# Patient Record
Sex: Female | Born: 1945
Health system: Southern US, Community
[De-identification: ages and names within clinical notes are randomized; demographics above are authoritative.]

## PROBLEM LIST (undated history)

## (undated) DIAGNOSIS — F0390 Unspecified dementia without behavioral disturbance: Secondary | ICD-10-CM

## (undated) DIAGNOSIS — E119 Type 2 diabetes mellitus without complications: Secondary | ICD-10-CM

## (undated) DIAGNOSIS — G5601 Carpal tunnel syndrome, right upper limb: Secondary | ICD-10-CM

## (undated) HISTORY — DX: Unspecified dementia, unspecified severity, without behavioral disturbance, psychotic disturbance, mood disturbance, and anxiety: F03.90

## (undated) HISTORY — DX: Type 2 diabetes mellitus without complications: E11.9

## (undated) HISTORY — PX: ABDOMINAL HYSTERECTOMY: SHX81

## (undated) HISTORY — DX: Carpal tunnel syndrome, right upper limb: G56.01

---

## 2013-12-12 DIAGNOSIS — F02818 Dementia in other diseases classified elsewhere, unspecified severity, with other behavioral disturbance: Secondary | ICD-10-CM

## 2013-12-12 HISTORY — DX: Dementia in other diseases classified elsewhere, unspecified severity, with other behavioral disturbance: F02.818

## 2014-08-24 DIAGNOSIS — R0782 Intercostal pain: Secondary | ICD-10-CM

## 2014-08-24 HISTORY — DX: Intercostal pain: R07.82

## 2015-07-03 DIAGNOSIS — M25511 Pain in right shoulder: Secondary | ICD-10-CM

## 2015-07-03 DIAGNOSIS — M5431 Sciatica, right side: Secondary | ICD-10-CM

## 2015-07-03 HISTORY — DX: Sciatica, right side: M54.31

## 2015-07-03 HISTORY — DX: Pain in right shoulder: M25.511

## 2015-08-12 DIAGNOSIS — M25562 Pain in left knee: Secondary | ICD-10-CM | POA: Insufficient documentation

## 2016-01-20 DIAGNOSIS — G5621 Lesion of ulnar nerve, right upper limb: Secondary | ICD-10-CM | POA: Insufficient documentation

## 2016-01-20 HISTORY — DX: Lesion of ulnar nerve, right upper limb: G56.21

## 2017-05-10 DIAGNOSIS — M542 Cervicalgia: Secondary | ICD-10-CM

## 2017-05-10 HISTORY — DX: Cervicalgia: M54.2

## 2018-05-17 DIAGNOSIS — J309 Allergic rhinitis, unspecified: Secondary | ICD-10-CM | POA: Insufficient documentation

## 2018-05-17 DIAGNOSIS — E66811 Obesity, class 1: Secondary | ICD-10-CM

## 2018-05-17 DIAGNOSIS — E669 Obesity, unspecified: Secondary | ICD-10-CM | POA: Insufficient documentation

## 2018-05-17 DIAGNOSIS — M549 Dorsalgia, unspecified: Secondary | ICD-10-CM | POA: Insufficient documentation

## 2018-05-17 HISTORY — DX: Obesity, class 1: E66.811

## 2018-05-17 HISTORY — DX: Obesity, unspecified: E66.9

## 2018-05-17 HISTORY — DX: Dorsalgia, unspecified: M54.9

## 2018-05-17 HISTORY — DX: Allergic rhinitis, unspecified: J30.9

## 2018-11-15 DIAGNOSIS — R202 Paresthesia of skin: Secondary | ICD-10-CM

## 2018-11-15 DIAGNOSIS — R7301 Impaired fasting glucose: Secondary | ICD-10-CM

## 2018-11-15 HISTORY — DX: Impaired fasting glucose: R73.01

## 2018-11-15 HISTORY — DX: Paresthesia of skin: R20.2

## 2020-08-05 ENCOUNTER — Ambulatory Visit: Payer: Medicare PPO | Admitting: Internal Medicine

## 2020-08-05 ENCOUNTER — Encounter: Payer: Self-pay | Admitting: Internal Medicine

## 2020-08-05 ENCOUNTER — Other Ambulatory Visit: Payer: Self-pay

## 2020-08-05 ENCOUNTER — Telehealth: Payer: Self-pay | Admitting: *Deleted

## 2020-08-05 VITALS — BP 110/80 | HR 88 | Temp 98.6°F | Ht 63.39 in | Wt 183.0 lb

## 2020-08-05 DIAGNOSIS — R4586 Emotional lability: Secondary | ICD-10-CM

## 2020-08-05 DIAGNOSIS — E119 Type 2 diabetes mellitus without complications: Secondary | ICD-10-CM | POA: Diagnosis not present

## 2020-08-05 DIAGNOSIS — F32A Depression, unspecified: Secondary | ICD-10-CM | POA: Diagnosis not present

## 2020-08-05 DIAGNOSIS — F0390 Unspecified dementia without behavioral disturbance: Secondary | ICD-10-CM | POA: Diagnosis not present

## 2020-08-05 LAB — MICROSCOPIC EXAMINATION: WBC, UA: NONE SEEN /hpf (ref 0–5)

## 2020-08-05 LAB — URINALYSIS, ROUTINE W REFLEX MICROSCOPIC
Bilirubin, UA: NEGATIVE
Glucose, UA: NEGATIVE
Ketones, UA: NEGATIVE
Leukocytes,UA: NEGATIVE
Nitrite, UA: NEGATIVE
Specific Gravity, UA: >1.03 — ABNORMAL HIGH (ref 1.005–1.030)
Urobilinogen, Ur: 0.2 mg/dL (ref 0.2–1.0)
pH, UA: 5 (ref 5.0–7.5)

## 2020-08-05 LAB — BAYER DCA HB A1C WAIVED: HB A1C (BAYER DCA - WAIVED): 5.7 % (ref <7.0)

## 2020-08-05 MED ORDER — CITALOPRAM HYDROBROMIDE 10 MG PO TABS: 10.0000 mg | ORAL_TABLET | Freq: Every day | ORAL | 1 refills | Status: DC

## 2020-08-05 NOTE — Chronic Care Management (AMB) (Deleted)
  Care Management  Note   08/05/2020 Name: Ronnisha Felber MRN: [id number] DOB: 01/29/1946  Veeda Virgo is a 75 y.o. year old female who is a primary care patient of [name], [name], MD. The care management team was consulted for assistance with chronic disease management and care coordination needs.   Ms. Jerrett was given information about Care Management services today including:  CCM service includes personalized support from designated clinical staff supervised by the physician, including individualized plan of care and coordination with other care providers 24/7 contact phone numbers for assistance for urgent and routine care needs. Service will only be billed when office clinical staff spend 20 minutes or more in a month to coordinate care. Only one practitioner may furnish and bill the service in a calendar month. The patient may stop CCM services at amy time (effective at the end of the month) by phone call to the office staff. The patient will be responsible for cost sharing (co-pay) or up to 20% of the service fee (after annual deductible is met)  Patient agreed to services and verbal consent obtained.  Follow up plan:   Telephone follow up appointment with care management team member scheduled for:  08/15/20 - message sent to SW to see if any sooner availability per pt request   Julian Hy, [place] Management  Direct Dial: [phone]
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
## 2020-08-05 NOTE — Chronic Care Management (AMB) (Signed)
  Chronic Care Management   Note  08/05/2020 Name: Erin Patrick MRN: [id number] DOB: [birth year]  Erin Patrick is a 75 y.o. year old female who is a primary care patient of [name], [name], MD. I reached out to [facility] by phone today in response to a referral sent by Erin Patrick's PCP Charlynne Cousins, MD     Erin Patrick was given information about Chronic Care Management services today including:  CCM service includes personalized support from designated clinical staff supervised by her physician, including individualized plan of care and coordination with other care providers 24/7 contact phone numbers for assistance for urgent and routine care needs. Service will only be billed when office clinical staff spend 20 minutes or more in a month to coordinate care. Only one practitioner may furnish and bill the service in a calendar month. The patient may stop CCM services at any time (effective at the end of the month) by phone call to the office staff. The patient will be responsible for cost sharing (co-pay) of up to 20% of the service fee (after annual deductible is met).  Patient agreed to services and verbal consent obtained.   Follow up plan: Telephone appointment with care management team member scheduled for: 08/15/2020  Julian Hy, [place] Management  Direct Dial: [phone]

## 2020-08-06 LAB — CBC WITH DIFFERENTIAL/PLATELET
Basophils Absolute: 0 10*3/uL (ref 0.0–0.2)
Basos: 0 %
EOS (ABSOLUTE): 0 10*3/uL (ref 0.0–0.4)
Eos: 1 %
Hematocrit: 43 % (ref 34.0–46.6)
Hemoglobin: 14.3 g/dL (ref 11.1–15.9)
Immature Grans (Abs): 0 10*3/uL (ref 0.0–0.1)
Immature Granulocytes: 0 %
Lymphocytes Absolute: 2.1 10*3/uL (ref 0.7–3.1)
Lymphs: 45 %
MCH: 27.9 pg (ref 26.6–33.0)
MCHC: 33.3 g/dL (ref 31.5–35.7)
MCV: 84 fL (ref 79–97)
Monocytes Absolute: 0.4 10*3/uL (ref 0.1–0.9)
Monocytes: 9 %
Neutrophils Absolute: 2.1 10*3/uL (ref 1.4–7.0)
Neutrophils: 45 %
Platelets: 270 10*3/uL (ref 150–450)
RBC: 5.13 x10E6/uL (ref 3.77–5.28)
RDW: 13.9 % (ref 11.7–15.4)
WBC: 4.6 10*3/uL (ref 3.4–10.8)

## 2020-08-06 LAB — BASIC METABOLIC PANEL
BUN/Creatinine Ratio: 11 — ABNORMAL LOW (ref 12–28)
BUN: 11 mg/dL (ref 8–27)
CO2: 19 mmol/L — ABNORMAL LOW (ref 20–29)
Calcium: 9.9 mg/dL (ref 8.7–10.3)
Chloride: 104 mmol/L (ref 96–106)
Creatinine, Ser: 1.01 mg/dL — ABNORMAL HIGH (ref 0.57–1.00)
Glucose: 124 mg/dL — ABNORMAL HIGH (ref 65–99)
Potassium: 4.4 mmol/L (ref 3.5–5.2)
Sodium: 140 mmol/L (ref 134–144)
eGFR: 57 mL/min/{1.73_m2} — ABNORMAL LOW (ref >59)

## 2020-08-06 LAB — HEPATIC FUNCTION PANEL
ALT: 27 IU/L (ref 0–32)
AST: 35 IU/L (ref 0–40)
Albumin: 4.9 g/dL — ABNORMAL HIGH (ref 3.7–4.7)
Alkaline Phosphatase: 96 IU/L (ref 44–121)
Bilirubin Total: 0.3 mg/dL (ref 0.0–1.2)
Bilirubin, Direct: 0.11 mg/dL (ref 0.00–0.40)
Total Protein: 7.3 g/dL (ref 6.0–8.5)

## 2020-08-06 LAB — LIPID PANEL
Chol/HDL Ratio: 3.6 ratio (ref 0.0–4.4)
Cholesterol, Total: 224 mg/dL — ABNORMAL HIGH (ref 100–199)
HDL: 63 mg/dL (ref >39)
LDL Chol Calc (NIH): 150 mg/dL — ABNORMAL HIGH (ref 0–99)
Triglycerides: 65 mg/dL (ref 0–149)
VLDL Cholesterol Cal: 11 mg/dL (ref 5–40)

## 2020-08-06 LAB — TSH: TSH: 1.58 u[IU]/mL (ref 0.450–4.500)

## 2020-08-06 LAB — VITAMIN B12: Vitamin B-12: 516 pg/mL (ref 232–1245)

## 2020-08-08 ENCOUNTER — Encounter: Payer: Self-pay | Admitting: Physician Assistant

## 2020-08-14 ENCOUNTER — Telehealth: Payer: Self-pay

## 2020-08-14 MED ORDER — ATORVASTATIN CALCIUM 10 MG PO TABS: 10.0000 mg | ORAL_TABLET | Freq: Every day | ORAL | 3 refills | Status: DC

## 2020-08-14 NOTE — Telephone Encounter (Signed)
LDL high everything else wnl, pl let her know I have sent in a statin for such and they need to fu as scheudled. thnx

## 2020-08-14 NOTE — Telephone Encounter (Signed)
Routing to provider to review labs. Do no see result message.

## 2020-08-14 NOTE — Telephone Encounter (Signed)
Patient's daughter notified.

## 2020-08-14 NOTE — Telephone Encounter (Signed)
Copied from CRM [id number]. Topic: General - Other >> Aug 14, 2020 10:26 AM Wyonia Hough E wrote: Reason for CRM: Pt daughter Elita Quick would like a call from the nurse so pt can hear about her lab results / please advise

## 2020-08-15 ENCOUNTER — Ambulatory Visit (INDEPENDENT_AMBULATORY_CARE_PROVIDER_SITE_OTHER): Payer: Medicare PPO | Admitting: Licensed Clinical Social Worker

## 2020-08-15 DIAGNOSIS — E119 Type 2 diabetes mellitus without complications: Secondary | ICD-10-CM | POA: Diagnosis not present

## 2020-08-15 DIAGNOSIS — F0390 Unspecified dementia without behavioral disturbance: Secondary | ICD-10-CM | POA: Diagnosis not present

## 2020-08-15 DIAGNOSIS — F32A Depression, unspecified: Secondary | ICD-10-CM

## 2020-08-15 NOTE — Patient Instructions (Signed)
Visit Information   Goals Addressed             This Visit's Progress    Depressive Symptoms Identified   On track    Patient Goals/Self-Care Activities: Over the next 120 days Attend all scheduled appointments with providers Contact clinic with any additional questions or concerns Continue compliance with medications Call [facility] to schedule your appointment for med management         Patient verbalizes understanding of instructions provided today and agrees to view in MyChart.   Telephone follow up appointment with care management team member scheduled for:09/12/20  [name], MSW, LCSW [facility] Care Management [facility]  [facility] [email or web address] Phone [phone] 12:17 PM

## 2020-08-15 NOTE — Chronic Care Management (AMB) (Signed)
Chronic Care Management    Clinical Social Work Note  08/15/2020 Name: Erin Patrick MRN: [id number] DOB: [birth year]  Erin Patrick is a 75 y.o. year old female who is a primary care patient of [name], [name], MD. The CCM team was consulted to assist the patient with chronic disease management and/or care coordination needs related to: [place] and Resources.   Engaged with patient's adult daughter, Erin Patrick, by telephone for initial visit in response to provider referral for social work chronic care management and care coordination services.   Consent to Services:  The patient was given the following information about Chronic Care Management services today, agreed to services, and gave verbal consent: 1. CCM service includes personalized support from designated clinical staff supervised by the primary care provider, including individualized plan of care and coordination with other care providers 2. 24/7 contact phone numbers for assistance for urgent and routine care needs. 3. Service will only be billed when office clinical staff spend 20 minutes or more in a month to coordinate care. 4. Only one practitioner may furnish and bill the service in a calendar month. 5.The patient may stop CCM services at any time (effective at the end of the month) by phone call to the office staff. 6. The patient will be responsible for cost sharing (co-pay) of up to 20% of the service fee (after annual deductible is met). Patient agreed to services and consent obtained.  Patient agreed to services and consent obtained.   Assessment: Patient is currently experiencing difficulty with management of depression symptoms. She receives strong support from family and has been compliant with medications. CCM LCSW provided family with contact information for [facility] and strongly encouraged them to schedule an initial appointment. Daughter would like to discuss with PCP if sertraline would be appropriate for patient to  assist with management of symptoms. This medication was recommended by [facility] home nurse. See Care Plan below for interventions and patient self-care actives. Recent life changes /stressors: Management of depression symptoms Recommendation: Patient may benefit from, and is in agreement to work with LCSW to address care coordination needs and will continue to work with the clinical team to address health care and disease management related needs.  Follow up Plan: Patient would like continued follow-up from CCM LCSW .  Follow up scheduled in on 09/12/20. Patient will call office if needed prior to next encounter.  SDOH (Social Determinants of Health) assessments and interventions performed:  SDOH Interventions    Flowsheet Row Most Recent Value  SDOH Interventions   Food Insecurity Interventions Intervention Not Indicated  Housing Interventions Intervention Not Indicated  Transportation Interventions Intervention Not Indicated        Advanced Directives Status: Not addressed in this encounter.  CCM Care Plan  Allergies  Allergen Reactions   Penicillins     Outpatient Encounter Medications as of 08/15/2020  Medication Sig   atorvastatin (LIPITOR) 10 MG tablet Take 1 tablet (10 mg total) by mouth daily.   citalopram (CELEXA) 10 MG tablet Take 1 tablet (10 mg total) by mouth daily.   No facility-administered encounter medications on file as of 08/15/2020.    There are no problems to display for this patient.   Conditions to be addressed/monitored: Depression; Mental Health Concerns   Care Plan : General Social Work (Adult)  Updates made by Erin Chesterfield, LCSW since 08/15/2020 12:00 AM     Problem: Depression Identification (Depression)      Long-Range Goal: Management of Depression Symptoms  Start Date: 08/15/2020  This Visit's Progress: On track  Priority: High  Note:   Current barriers:   Acute Mental Health needs related to depression Mental Health Concerns  Needs  Support, Education, and Care Coordination in order to meet unmet mental health needs. Clinical Goal(s): Over the next 120 days, patient will work with SW, counselor and therapist to reduce or manage symptoms of agitation, mood instability, stress, and bipolar until connected for ongoing counseling. Clinical Interventions:  Assessed patient's previous and current treatment, coping skills, support system and barriers to care  Patient's daughter, Erin Patrick, provided all information during visit Patient has difficulty managing recent increase in depression symptoms Patient's Nurse from [facility] completes home visits occasionally. Recently suggested that patient is prescribed sertraline due to success with management of symptoms for patients diagnosed with dementia.  Patient has been compliant with medications. There has been no side effects or decrease in symptoms as of yet Patient has an appointment with Neurology scheduled Patient receives strong support from family. She resides with daughter Patient may go grocery shopping with daughter, at times; however, she prefers to remain in the home Family are not interested in day programs or placement CCM LCSW reviewed upcoming appointments Depression screen reviewed , Solution-Focused Strategies, Active listening / Reflection utilized , Emotional Supportive Provided, Reviewed mental health medications with patient and discussed compliance: Patient is compliant with medications, and Caregiver stress acknowledged   Discussed several options for long term counseling based on need and insurance. Daughter prefers to complete psychiatry appointment prior to referring for long-term counseling Collaboration with PCP regarding development and update of comprehensive plan of care as evidenced by provider attestation and co-signature Inter-disciplinary care team collaboration (see longitudinal plan of care) Patient Goals/Self-Care Activities: Over the next 120  days Attend all scheduled appointments with providers Contact clinic with any additional questions or concerns Continue compliance with medications Call [facility] to schedule your appointment for med management       Christa See, MSW, [email or web address] Phone [phone] 12:16 PM

## 2020-08-16 ENCOUNTER — Other Ambulatory Visit: Payer: Self-pay

## 2020-08-16 ENCOUNTER — Encounter: Payer: Self-pay | Admitting: Physician Assistant

## 2020-08-16 ENCOUNTER — Ambulatory Visit: Payer: Medicare PPO | Admitting: Physician Assistant

## 2020-08-16 VITALS — BP 132/83 | HR 79 | Ht 62.0 in | Wt 180.2 lb

## 2020-08-16 DIAGNOSIS — R413 Other amnesia: Secondary | ICD-10-CM

## 2020-08-16 NOTE — Patient Instructions (Signed)
It was a pleasure to see you today at our office.   Recommendations:  Neurocognitive evaluation at our office MRI of the brain, the office will call you to arrange you appointment Follow up once the results of the above are available   RECOMMENDATIONS FOR ALL PATIENTS WITH MEMORY PROBLEMS: 1. Continue to exercise (Recommend 30 minutes of walking everyday, or 3 hours every week) 2. Increase social interactions - continue going to Church and enjoy social gatherings with friends and family 3. Eat healthy, avoid fried foods and eat more fruits and vegetables 4. Maintain adequate blood pressure, blood sugar, and blood cholesterol level. Reducing the risk of stroke and cardiovascular disease also helps promoting better memory. 5. Avoid stressful situations. Live a simple life and avoid aggravations. Organize your time and prepare for the next day in anticipation. 6. Sleep well, avoid any interruptions of sleep and avoid any distractions in the bedroom that may interfere with adequate sleep quality 7. Avoid sugar, avoid sweets as there is a strong link between excessive sugar intake, diabetes, and cognitive impairment We discussed the Mediterranean diet, which has been shown to help patients reduce the risk of progressive memory disorders and reduces cardiovascular risk. This includes eating fish, eat fruits and green leafy vegetables, nuts like almonds and hazelnuts, walnuts, and also use olive oil. Avoid fast foods and fried foods as much as possible. Avoid sweets and sugar as sugar use has been linked to worsening of memory function.  There is always a concern of gradual progression of memory problems. If this is the case, then we may need to adjust level of care according to patient needs. Support, both to the patient and caregiver, should then be put into place.      You have been referred for a neuropsychological evaluation (i.e., evaluation of memory and thinking abilities). Please bring  someone with you to this appointment if possible, as it is helpful for the doctor to hear from both you and another adult who knows you well. Please bring eyeglasses and hearing aids if you wear them.    The evaluation will take approximately 3 hours and has two parts:   The first part is a clinical interview with the neuropsychologist (Dr. Merz or Dr. Stewart). During the interview, the neuropsychologist will speak with you and the individual you brought to the appointment.    The second part of the evaluation is testing with the doctor's technician (Dana or Kim). During the testing, the technician will ask you to remember different types of material, solve problems, and answer some questionnaires. Your family member will not be present for this portion of the evaluation.   Please note: We must reserve several hours of the neuropsychologist's time and the psychometrician's time for your evaluation appointment. As such, there is a No-Show fee of $100. If you are unable to attend any of your appointments, please contact our office as soon as possible to reschedule.    FALL PRECAUTIONS: Be cautious when walking. Scan the area for obstacles that may increase the risk of trips and falls. When getting up in the mornings, sit up at the edge of the bed for a few minutes before getting out of bed. Consider elevating the bed at the head end to avoid drop of blood pressure when getting up. Walk always in a well-lit room (use night lights in the walls). Avoid area rugs or power cords from appliances in the middle of the walkways. Use a walker or a cane if   necessary and consider physical therapy for balance exercise. Get your eyesight checked regularly.  FINANCIAL OVERSIGHT: Supervision, especially oversight when making financial decisions or transactions is also recommended.  HOME SAFETY: Consider the safety of the kitchen when operating appliances like stoves, microwave oven, and blender. Consider having  supervision and share cooking responsibilities until no longer able to participate in those. Accidents with firearms and other hazards in the house should be identified and addressed as well.   ABILITY TO BE LEFT ALONE: If patient is unable to contact 911 operator, consider using LifeLine, or when the need is there, arrange for someone to stay with patients. Smoking is a fire hazard, consider supervision or cessation. Risk of wandering should be assessed by caregiver and if detected at any point, supervision and safe proof recommendations should be instituted.  MEDICATION SUPERVISION: Inability to self-administer medication needs to be constantly addressed. Implement a mechanism to ensure safe administration of the medications.   DRIVING: Regarding driving, in patients with progressive memory problems, driving will be impaired. We advise to have someone else do the driving if trouble finding directions or if minor accidents are reported. Independent driving assessment is available to determine safety of driving.   If you are interested in the driving assessment, you can contact the following:  [employer] in [place] [phone]  [facility] [phone]  [facility] [phone]  [facility] [phone] or [phone]    Mediterranean Diet A Mediterranean diet refers to food and lifestyle choices that are based on the traditions of countries located on the [place]. This way of eating has been shown to help prevent certain conditions and improve outcomes for people who have chronic diseases, like kidney disease and heart disease. What are tips for following this plan? Lifestyle  Cook and eat meals together with your family, when possible. Drink enough fluid to keep your urine clear or pale yellow. Be physically active every day. This includes: Aerobic exercise like running or swimming. Leisure activities like gardening,  walking, or housework. Get 7-8 hours of sleep each night. If recommended by your health care provider, drink red wine in moderation. This means 1 glass a day for nonpregnant women and 2 glasses a day for men. A glass of wine equals 5 oz (150 mL). Reading food labels  Check the serving size of packaged foods. For foods such as rice and pasta, the serving size refers to the amount of cooked product, not dry. Check the total fat in packaged foods. Avoid foods that have saturated fat or trans fats. Check the ingredients list for added sugars, such as corn syrup. Shopping  At the grocery store, buy most of your food from the areas near the walls of the store. This includes: Fresh fruits and vegetables (produce). Grains, beans, nuts, and seeds. Some of these may be available in unpackaged forms or large amounts (in bulk). Fresh seafood. Poultry and eggs. Low-fat dairy products. Buy whole ingredients instead of prepackaged foods. Buy fresh fruits and vegetables in-season from local farmers markets. Buy frozen fruits and vegetables in resealable bags. If you do not have access to quality fresh seafood, buy precooked frozen shrimp or canned fish, such as tuna, salmon, or sardines. Buy small amounts of raw or cooked vegetables, salads, or olives from the deli or salad bar at your store. Stock your pantry so you always have certain foods on hand, such as olive oil, canned tuna, canned tomatoes, rice, pasta, and beans. Cooking  Cook foods   with extra-virgin olive oil instead of using butter or other vegetable oils. Have meat as a side dish, and have vegetables or grains as your main dish. This means having meat in small portions or adding small amounts of meat to foods like pasta or stew. Use beans or vegetables instead of meat in common dishes like chili or lasagna. Experiment with different cooking methods. Try roasting or broiling vegetables instead of steaming or sauteing them. Add frozen vegetables  to soups, stews, pasta, or rice. Add nuts or seeds for added healthy fat at each meal. You can add these to yogurt, salads, or vegetable dishes. Marinate fish or vegetables using olive oil, lemon juice, garlic, and fresh herbs. Meal planning  Plan to eat 1 vegetarian meal one day each week. Try to work up to 2 vegetarian meals, if possible. Eat seafood 2 or more times a week. Have healthy snacks readily available, such as: Vegetable sticks with hummus. Greek yogurt. Fruit and nut trail mix. Eat balanced meals throughout the week. This includes: Fruit: 2-3 servings a day Vegetables: 4-5 servings a day Low-fat dairy: 2 servings a day Fish, poultry, or lean meat: 1 serving a day Beans and legumes: 2 or more servings a week Nuts and seeds: 1-2 servings a day Whole grains: 6-8 servings a day Extra-virgin olive oil: 3-4 servings a day Limit red meat and sweets to only a few servings a month What are my food choices? Mediterranean diet Recommended Grains: Whole-grain pasta. Brown rice. Bulgar wheat. Polenta. Couscous. Whole-wheat bread. Oatmeal. Quinoa. Vegetables: Artichokes. Beets. Broccoli. Cabbage. Carrots. Eggplant. Green beans. Chard. Kale. Spinach. Onions. Leeks. Peas. Squash. Tomatoes. Peppers. Radishes. Fruits: Apples. Apricots. Avocado. Berries. Bananas. Cherries. Dates. Figs. Grapes. Lemons. Melon. Oranges. Peaches. Plums. Pomegranate. Meats and other protein foods: Beans. Almonds. Sunflower seeds. Pine nuts. Peanuts. Cod. Salmon. Scallops. Shrimp. Tuna. Tilapia. Clams. Oysters. Eggs. Dairy: Low-fat milk. Cheese. Greek yogurt. Beverages: Water. Red wine. Herbal tea. Fats and oils: Extra virgin olive oil. Avocado oil. Grape seed oil. Sweets and desserts: Greek yogurt with honey. Baked apples. Poached pears. Trail mix. Seasoning and other foods: Basil. Cilantro. Coriander. Cumin. Mint. Parsley. Sage. Rosemary. Tarragon. Garlic. Oregano. Thyme. Pepper. Balsalmic vinegar. Tahini.  Hummus. Tomato sauce. Olives. Mushrooms. Limit these Grains: Prepackaged pasta or rice dishes. Prepackaged cereal with added sugar. Vegetables: Deep fried potatoes (french fries). Fruits: Fruit canned in syrup. Meats and other protein foods: Beef. Pork. Lamb. Poultry with skin. Hot dogs. Bacon. Dairy: Ice cream. Sour cream. Whole milk. Beverages: Juice. Sugar-sweetened soft drinks. Beer. Liquor and spirits. Fats and oils: Butter. Canola oil. Vegetable oil. Beef fat (tallow). Lard. Sweets and desserts: Cookies. Cakes. Pies. Candy. Seasoning and other foods: Mayonnaise. Premade sauces and marinades. The items listed may not be a complete list. Talk with your dietitian about what dietary choices are right for you. Summary The Mediterranean diet includes both food and lifestyle choices. Eat a variety of fresh fruits and vegetables, beans, nuts, seeds, and whole grains. Limit the amount of red meat and sweets that you eat. Talk with your health care provider about whether it is safe for you to drink red wine in moderation. This means 1 glass a day for nonpregnant women and 2 glasses a day for men. A glass of wine equals 5 oz (150 mL). This information is not intended to replace advice given to you by your health care provider. Make sure you discuss any questions you have with your health care provider. Document Released: 09/26/2015 Document Revised: 10/29/2015   Document Reviewed: 09/26/2015 Elsevier Interactive Patient Education  2017 [employer] Inc[employer]     
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
## 2020-08-26 ENCOUNTER — Ambulatory Visit
Admission: RE | Admit: 2020-08-26 | Discharge: 2020-08-26 | Disposition: A | Discharge status: Home / Self Care | Payer: Medicare PPO | Source: Ambulatory Visit | Attending: Internal Medicine | Admitting: Internal Medicine

## 2020-08-26 ENCOUNTER — Ambulatory Visit: Payer: Medicare PPO | Admitting: Internal Medicine

## 2020-08-26 ENCOUNTER — Ambulatory Visit
Admission: RE | Admit: 2020-08-26 | Discharge: 2020-08-26 | Disposition: A | Discharge status: Home / Self Care | Payer: Medicare PPO | Attending: Internal Medicine | Admitting: Internal Medicine

## 2020-08-26 ENCOUNTER — Encounter: Payer: Self-pay | Admitting: Internal Medicine

## 2020-08-26 ENCOUNTER — Other Ambulatory Visit: Payer: Self-pay

## 2020-08-26 VITALS — BP 107/72 | HR 75 | Temp 98.5°F | Ht 63.39 in | Wt 180.4 lb

## 2020-08-26 DIAGNOSIS — F32A Depression, unspecified: Secondary | ICD-10-CM

## 2020-08-26 DIAGNOSIS — E119 Type 2 diabetes mellitus without complications: Secondary | ICD-10-CM

## 2020-08-26 DIAGNOSIS — M25512 Pain in left shoulder: Secondary | ICD-10-CM | POA: Diagnosis not present

## 2020-08-26 DIAGNOSIS — R413 Other amnesia: Secondary | ICD-10-CM

## 2020-08-26 DIAGNOSIS — Z1329 Encounter for screening for other suspected endocrine disorder: Secondary | ICD-10-CM

## 2020-08-26 DIAGNOSIS — Z136 Encounter for screening for cardiovascular disorders: Secondary | ICD-10-CM

## 2020-08-26 MED ORDER — ATORVASTATIN CALCIUM 10 MG PO TABS: 10.0000 mg | ORAL_TABLET | Freq: Every day | ORAL | 3 refills | Status: DC

## 2020-08-26 MED ORDER — CITALOPRAM HYDROBROMIDE 10 MG PO TABS: 10.0000 mg | ORAL_TABLET | Freq: Every morning | ORAL | 1 refills | Status: DC

## 2020-08-26 NOTE — Patient Instructions (Addendum)
Prediabetes Eating Plan Prediabetes is a condition that causes blood sugar (glucose) levels to be higher than normal. This increases the risk for developing type 2 diabetes (type 2 diabetes mellitus). Working with a health care provider or nutrition specialist (dietitian) to make diet and lifestyle changes can help prevent the onset of diabetes. These changes may help you: Control your blood glucose levels. Improve your cholesterol levels. Manage your blood pressure. What are tips for following this plan? Reading food labels Read food labels to check the amount of fat, salt (sodium), and sugar in prepackaged foods. Avoid foods that have: Saturated fats. Trans fats. Added sugars. Avoid foods that have more than 300 milligrams (mg) of sodium per serving. Limit your sodium intake to less than 2,300 mg each day. Shopping Avoid buying pre-made and processed foods. Avoid buying drinks with added sugar. Cooking Cook with olive oil. Do not use butter, lard, or ghee. Bake, broil, grill, steam, or boil foods. Avoid frying. Meal planning  Work with your dietitian to create an eating plan that is right for you. This may include tracking how many calories you take in each day. Use a food diary, notebook, or mobile application to track what you eat at each meal. Consider following a Mediterranean diet. This includes: Eating several servings of fresh fruits and vegetables each day. Eating fish at least twice a week. Eating one serving each day of whole grains, beans, nuts, and seeds. Using olive oil instead of other fats. Limiting alcohol. Limiting red meat. Using nonfat or low-fat dairy products. Consider following a plant-based diet. This includes dietary choices that focus on eating mostly vegetables and fruit, grains, beans, nuts, and seeds. If you have high blood pressure, you may need to limit your sodium intake or follow a diet such as the DASH (Dietary Approaches to Stop Hypertension) eating  plan. The DASH diet aims to lower high blood pressure.  Lifestyle Set weight loss goals with help from your health care team. It is recommended that most people with prediabetes lose 7% of their body weight. Exercise for at least 30 minutes 5 or more days a week. Attend a support group or seek support from a mental health counselor. Take over-the-counter and prescription medicines only as told by your health care provider. What foods are recommended? Fruits Berries. Bananas. Apples. Oranges. Grapes. Papaya. Mango. Pomegranate. Kiwi.Grapefruit. Cherries. Vegetables Lettuce. Spinach. Peas. Beets. Cauliflower. Cabbage. Broccoli. Carrots.Tomatoes. Squash. Eggplant. Herbs. Peppers. Onions. Cucumbers. Brussels sprouts. Grains Whole grains, such as whole-wheat or whole-grain breads, crackers, cereals, and pasta. Unsweetened oatmeal. Bulgur. Barley. Quinoa. Brown rice. Corn orwhole-wheat flour tortillas or taco shells. Meats and other proteins Seafood. Poultry without skin. Lean cuts of pork and beef. Tofu. Eggs. Nuts.Beans. Dairy Low-fat or fat-free dairy products, such as yogurt, cottage cheese, and cheese. Beverages Water. Tea. Coffee. Sugar-free or diet soda. Seltzer water. Low-fat or nonfatmilk. Milk alternatives, such as soy or almond milk. Fats and oils Olive oil. Canola oil. Sunflower oil. Grapeseed oil. Avocado. Walnuts. Sweets and desserts Sugar-free or low-fat pudding. Sugar-free or low-fat ice cream and other frozentreats. Seasonings and condiments Herbs. Sodium-free spices. Mustard. Relish. Low-salt, low-sugar ketchup.Low-salt, low-sugar barbecue sauce. Low-fat or fat-free mayonnaise. The items listed above may not be a complete list of recommended foods and beverages. Contact a dietitian for more information. What foods are not recommended? Fruits Fruits canned with syrup. Vegetables Canned vegetables. Frozen vegetables with butter or cream sauce. Grains Refined white flour and  flour products, such as bread, pasta, snack  foods, andcereals. Meats and other proteins Fatty cuts of meat. Poultry with skin. Breaded or fried meat. Processed meats. Dairy Full-fat yogurt, cheese, or milk. Beverages Sweetened drinks, such as iced tea and soda. Fats and oils Butter. Lard. Ghee. Sweets and desserts Baked goods, such as cake, cupcakes, pastries, cookies, and cheesecake. Seasonings and condiments Spice mixes with added salt. Ketchup. Barbecue sauce. Mayonnaise. The items listed above may not be a complete list of foods and beverages that are not recommended. Contact a dietitian for more information. Where to find more information American Diabetes Association: www.diabetes.org Summary You may need to make diet and lifestyle changes to help prevent the onset of diabetes. These changes can help you control blood sugar, improve cholesterol levels, and manage blood pressure. Set weight loss goals with help from your health care team. It is recommended that most people with prediabetes lose 7% of their body weight. Consider following a Mediterranean diet. This includes eating plenty of fresh fruits and vegetables, whole grains, beans, nuts, seeds, fish, and low-fat dairy, and using olive oil instead of other fats. This information is not intended to replace advice given to you by your health care provider. Make sure you discuss any questions you have with your healthcare provider. Document Revised: 05/04/2019 Document Reviewed: 05/04/2019 Elsevier Patient Education  [address]    Results for Erin Patrick, Erin Patrick (MRN [id number]) as of 08/26/2020 16:08  Ref. Range 08/05/2020 09:38  BASIC METABOLIC PANEL Unknown Rpt (A)  Sodium Latest Ref Range: 134 - 144 mmol/L 140  Potassium Latest Ref Range: 3.5 - 5.2 mmol/L 4.4  Chloride Latest Ref Range: 96 - 106 mmol/L 104  CO2 Latest Ref Range: 20 - 29 mmol/L 19 (L)  Glucose Latest Ref Range: 65 - 99 mg/dL 124 (H)  BUN Latest Ref Range:  8 - 27 mg/dL 11  Creatinine Latest Ref Range: 0.57 - 1.00 mg/dL 1.01 (H)  Calcium Latest Ref Range: 8.7 - 10.3 mg/dL 9.9  BUN/Creatinine Ratio Latest Ref Range: 12 - 28  11 (L)  eGFR Latest Ref Range: >59 mL/min/1.73 57 (L)  Alkaline Phosphatase Latest Ref Range: 44 - 121 IU/L 96  Albumin Latest Ref Range: 3.7 - 4.7 g/dL 4.9 (H)  AST Latest Ref Range: 0 - 40 IU/L 35  ALT Latest Ref Range: 0 - 32 IU/L 27  Total Protein Latest Ref Range: 6.0 - 8.5 g/dL 7.3  Total Bilirubin Latest Ref Range: 0.0 - 1.2 mg/dL 0.3  BILIRUBIN, DIRECT Latest Ref Range: 0.00 - 0.40 mg/dL 0.11  Total CHOL/HDL Ratio Latest Ref Range: 0.0 - 4.4 ratio 3.6  Cholesterol, Total Latest Ref Range: 100 - 199 mg/dL 224 (H)  HDL Cholesterol Latest Ref Range: >39 mg/dL 63  Triglycerides Latest Ref Range: 0 - 149 mg/dL 65  VLDL Cholesterol Cal Latest Ref Range: 5 - 40 mg/dL 11  LDL Chol Calc (NIH) Latest Ref Range: 0 - 99 mg/dL 150 (H)  Vitamin B12 Latest Ref Range: 232 - 1,245 pg/mL 516  WBC Latest Ref Range: 3.4 - 10.8 x10E3/uL 4.6  RBC Latest Ref Range: 3.77 - 5.28 x10E6/uL 5.13  Hemoglobin Latest Ref Range: 11.1 - 15.9 g/dL 14.3  HCT Latest Ref Range: 34.0 - 46.6 % 43.0  MCV Latest Ref Range: 79 - 97 fL 84  MCH Latest Ref Range: 26.6 - 33.0 pg 27.9  MCHC Latest Ref Range: 31.5 - 35.7 g/dL 33.3  RDW Latest Ref Range: 11.7 - 15.4 % 13.9  Platelets Latest Ref Range: 150 - 450  x10E3/uL 270  Neutrophils Latest Ref Range: Not Estab. % 45  Immature Granulocytes Latest Ref Range: Not Estab. % 0  NEUT# Latest Ref Range: 1.4 - 7.0 x10E3/uL 2.1  Lymphocyte # Latest Ref Range: 0.7 - 3.1 x10E3/uL 2.1  Monocytes Absolute Latest Ref Range: 0.1 - 0.9 x10E3/uL 0.4  Basophils Absolute Latest Ref Range: 0.0 - 0.2 x10E3/uL 0.0  Immature Grans (Abs) Latest Ref Range: 0.0 - 0.1 x10E3/uL 0.0  Lymphs Latest Ref Range: Not Estab. % 45  Monocytes Latest Ref Range: Not Estab. % 9  Basos Latest Ref Range: Not Estab. % 0  Eos Latest Ref  Range: Not Estab. % 1  EOS (ABSOLUTE) Latest Ref Range: 0.0 - 0.4 x10E3/uL 0.0  TSH Latest Ref Range: 0.450 - 4.500 uIU/mL 1.580

## 2020-08-26 NOTE — Progress Notes (Signed)
BP 107/72   Pulse 75   Temp 98.5 F (36.9 C) (Oral)   Ht 5' 3.39" (1.61 m)   Wt 180 lb 6.4 oz (81.8 kg)   LMP  (LMP Unknown)   SpO2 97%   BMI 31.57 kg/m    Subjective:    Patient ID: Erin Patrick, female    DOB: [birth year], 75 y.o.   MRN: [id number]  Chief Complaint  Patient presents with   Diabetes   Review labs   Depression    Was prescribed Celexa, has not been taking, states that she lost it.     HPI: Erin Patrick is a 75 y.o. female  Depression        This is a chronic problem.  The onset quality is gradual.   Associated symptoms include no myalgias. Shoulder Pain  This is a chronic problem. The current episode started more than 1 year ago.    Chief Complaint  Patient presents with   Diabetes   Review labs   Depression    Was prescribed Celexa, has not been taking, states that she lost it.     Relevant past medical, surgical, family and social history reviewed and updated as indicated. Interim medical history since our last visit reviewed. Allergies and medications reviewed and updated.  Review of Systems  Musculoskeletal:  Negative for myalgias.  Psychiatric/Behavioral:  Positive for depression.    Per HPI unless specifically indicated above     Objective:    BP 107/72   Pulse 75   Temp 98.5 F (36.9 C) (Oral)   Ht 5' 3.39" (1.61 m)   Wt 180 lb 6.4 oz (81.8 kg)   LMP  (LMP Unknown)   SpO2 97%   BMI 31.57 kg/m   Wt Readings from Last 3 Encounters:  08/26/20 180 lb 6.4 oz (81.8 kg)  08/16/20 180 lb 3.2 oz (81.7 kg)  08/05/20 183 lb (83 kg)    Physical Exam  Results for orders placed or performed in visit on 08/05/20  Microscopic Examination   BLD  Result Value Ref Range   WBC, UA None seen 0 - 5 /hpf   RBC 0-2 0 - 2 /hpf   Epithelial Cells (non renal) 0-10 0 - 10 /hpf   Renal Epithel, UA 0-10 (A) None seen /hpf   Mucus, UA Present (A) Not Estab.   Bacteria, UA Few (A) None seen/Few  Bayer DCA Hb A1c Waived  Result Value Ref Range    HB A1C (BAYER DCA - WAIVED) 5.7 <7.0 %  Vitamin B12  Result Value Ref Range   Vitamin B-12 516 232 - 1,245 pg/mL  CBC with Differential/Platelet  Result Value Ref Range   WBC 4.6 3.4 - 10.8 x10E3/uL   RBC 5.13 3.77 - 5.28 x10E6/uL   Hemoglobin 14.3 11.1 - 15.9 g/dL   Hematocrit 43.0 34.0 - 46.6 %   MCV 84 79 - 97 fL   MCH 27.9 26.6 - 33.0 pg   MCHC 33.3 31.5 - 35.7 g/dL   RDW 13.9 11.7 - 15.4 %   Platelets 270 150 - 450 x10E3/uL   Neutrophils 45 Not Estab. %   Lymphs 45 Not Estab. %   Monocytes 9 Not Estab. %   Eos 1 Not Estab. %   Basos 0 Not Estab. %   Neutrophils Absolute 2.1 1.4 - 7.0 x10E3/uL   Lymphocytes Absolute 2.1 0.7 - 3.1 x10E3/uL   Monocytes Absolute 0.4 0.1 - 0.9 x10E3/uL   EOS (ABSOLUTE) 0.0  0.0 - 0.4 x10E3/uL   Basophils Absolute 0.0 0.0 - 0.2 x10E3/uL   Immature Granulocytes 0 Not Estab. %   Immature Grans (Abs) 0.0 0.0 - 0.1 x10E3/uL  Lipid panel  Result Value Ref Range   Cholesterol, Total 224 (H) 100 - 199 mg/dL   Triglycerides 65 0 - 149 mg/dL   HDL 63 >39 mg/dL   VLDL Cholesterol Cal 11 5 - 40 mg/dL   LDL Chol Calc (NIH) 150 (H) 0 - 99 mg/dL   Chol/HDL Ratio 3.6 0.0 - 4.4 ratio  TSH  Result Value Ref Range   TSH 1.580 0.450 - 4.500 uIU/mL  Urinalysis, Routine w reflex microscopic  Result Value Ref Range   Specific Gravity, UA >1.030 (H) 1.005 - 1.030   pH, UA 5.0 5.0 - 7.5   Color, UA Yellow Yellow   Appearance Ur Cloudy (A) Clear   Leukocytes,UA Negative Negative   Protein,UA Trace (A) Negative/Trace   Glucose, UA Negative Negative   Ketones, UA Negative Negative   RBC, UA Trace (A) Negative   Bilirubin, UA Negative Negative   Urobilinogen, Ur 0.2 0.2 - 1.0 mg/dL   Nitrite, UA Negative Negative   Microscopic Examination See below:   Basic metabolic panel  Result Value Ref Range   Glucose 124 (H) 65 - 99 mg/dL   BUN 11 8 - 27 mg/dL   Creatinine, Ser 1.01 (H) 0.57 - 1.00 mg/dL   eGFR 57 (L) >59 mL/min/1.73   BUN/Creatinine Ratio 11  (L) 12 - 28   Sodium 140 134 - 144 mmol/L   Potassium 4.4 3.5 - 5.2 mmol/L   Chloride 104 96 - 106 mmol/L   CO2 19 (L) 20 - 29 mmol/L   Calcium 9.9 8.7 - 10.3 mg/dL  Hepatic function panel  Result Value Ref Range   Total Protein 7.3 6.0 - 8.5 g/dL   Albumin 4.9 (H) 3.7 - 4.7 g/dL   Bilirubin Total 0.3 0.0 - 1.2 mg/dL   Bilirubin, Direct 0.11 0.00 - 0.40 mg/dL   Alkaline Phosphatase 96 44 - 121 IU/L   AST 35 0 - 40 IU/L   ALT 27 0 - 32 IU/L        Current Outpatient Medications:    atorvastatin (LIPITOR) 10 MG tablet, Take 1 tablet (10 mg total) by mouth daily., Disp: 30 tablet, Rfl: 3   citalopram (CELEXA) 10 MG tablet, Take 1 tablet (10 mg total) by mouth daily., Disp: 30 tablet, Rfl: 1   Multiple Vitamins-Minerals (MULTIVITAMIN WITH MINERALS) tablet, Take 1 tablet by mouth daily., Disp: , Rfl:     Assessment & Plan:  DM : check HbA1c,  urine  microalbumin  diabetic diet plan given to pt  adviced regarding hypoglycemia and instructions given to pt today on how to prevent and treat the same if it were to occur. pt acknowledges the plan and voices understanding of the same.  exercise plan given and encouraged.   advice diabetic yearly podiatry, ophthalmology , nutritionist , dental check q 6 months,   2. Depression :CCM for wu with Education officer, museum.  Will start pt on celexa 10 mg . Unsure if she is going to take this per daughter Will refer to psych hasnt been established   Daughter was tearful too says mom accuses everyone has terrible mood swings Might not take meds Fu with me in 3 weeks.   3. Memory loss : will refer to neurology to fu with tests has to have MRI of brain ,  Neurocognitive testing to further evaluate causes of cognitive changes, including the possibility of underlying mood disorder contributing to changes    Meds ordered this encounter  Medications   citalopram (CELEXA) 10 MG tablet    Sig: Take 1 tablet (10 mg total) by mouth in the morning.    Dispense:  30  tablet    Refill:  1    Pl fill meds blister pak for medications. Thnx.     Follow up plan: No follow-ups on file.

## 2020-08-27 ENCOUNTER — Other Ambulatory Visit: Payer: Self-pay

## 2020-08-27 DIAGNOSIS — M25512 Pain in left shoulder: Secondary | ICD-10-CM

## 2020-08-27 NOTE — Progress Notes (Signed)
IMPRESSION: Degenerative change without acute abnormality.  Needs to fu with ortho ok to refer thnx

## 2020-09-04 ENCOUNTER — Telehealth: Payer: Self-pay

## 2020-09-04 NOTE — Telephone Encounter (Signed)
Copied from CRM [id number]. Topic: Referral - Status >> Sep 04, 2020 11:45 AM Marylen Ponto wrote: Reason for CRM: Pt daughter stated she contacted the psychiatrist office to schedule pt for an appt and she was told someone would call them back but they have not heard back from anyone.

## 2020-09-09 ENCOUNTER — Other Ambulatory Visit: Payer: Self-pay

## 2020-09-09 ENCOUNTER — Ambulatory Visit
Admission: RE | Admit: 2020-09-09 | Discharge: 2020-09-09 | Disposition: A | Discharge status: Home / Self Care | Payer: Medicare PPO | Source: Ambulatory Visit | Attending: Internal Medicine | Admitting: Internal Medicine

## 2020-09-09 DIAGNOSIS — R413 Other amnesia: Secondary | ICD-10-CM | POA: Insufficient documentation

## 2020-09-09 MED ORDER — GADOBUTROL 1 MMOL/ML IV SOLN
6.0000 mL | Freq: Once | INTRAVENOUS | Status: AC | PRN
  Administered 2020-09-09: 5 mL via INTRAVENOUS

## 2020-09-09 NOTE — Progress Notes (Signed)
Please let pt know has Mild chronic small vessel ischemic disease and cerebral atrophy. 3. Prominent bilateral mesial temporal lobe volume loss. Per MRI  Needs to see Neurology please checek status of rereferrals. Pl call daugher- [phone] pams number

## 2020-09-12 ENCOUNTER — Ambulatory Visit (INDEPENDENT_AMBULATORY_CARE_PROVIDER_SITE_OTHER): Payer: Medicare PPO | Admitting: Licensed Clinical Social Worker

## 2020-09-12 DIAGNOSIS — F32A Depression, unspecified: Secondary | ICD-10-CM | POA: Diagnosis not present

## 2020-09-12 DIAGNOSIS — F0390 Unspecified dementia without behavioral disturbance: Secondary | ICD-10-CM | POA: Diagnosis not present

## 2020-09-12 DIAGNOSIS — E119 Type 2 diabetes mellitus without complications: Secondary | ICD-10-CM

## 2020-09-12 DIAGNOSIS — R413 Other amnesia: Secondary | ICD-10-CM

## 2020-09-13 NOTE — Chronic Care Management (AMB) (Signed)
Chronic Care Management    Clinical Social Work Note  09/13/2020 Name: Erin Patrick MRN: [id number] DOB: 17-Sep-1945  Erin Patrick is a 75 y.o. year old female who is a primary care patient of [name], [name], MD. The CCM team was consulted to assist the patient with chronic disease management and/or care coordination needs related to: Mental Health Counseling and Resources and Caregiver Stress.   Engaged with patient's daughter by telephone for follow up visit in response to provider referral for social work chronic care management and care coordination services.   Consent to Services:  The patient was given information about Chronic Care Management services, agreed to services, and gave verbal consent prior to initiation of services.  Please see initial visit note for detailed documentation.   Patient agreed to services and consent obtained.   Assessment: Patient's daughter provided information during visit. Patient continues to maintain positive progress with care plan goals. She has difficulty taking prescribed medications. Patient's daughter agreed to contact [facility] to schedule initial psychiatry appointment. See Care Plan below for interventions and patient self-care actives.  Recent life changes Erin Patrick: Caregiver Stress  Recommendation: Patient may benefit from, and is in agreement to work with Erin Patrick to address care coordination needs and will continue to work with the clinical team to address health care and disease management related needs.  Follow up Plan: Patient would like continued follow-up from CCM Erin Patrick .  Follow up scheduled in 10/24/20. Patient will call office if needed prior to next encounter.   SDOH (Social Determinants of Health) assessments and interventions performed:    Advanced Directives Status: Not addressed in this encounter.  CCM Care Plan  Allergies  Allergen Reactions   Penicillins     Outpatient Encounter Medications as of 09/12/2020  Medication Sig    atorvastatin (LIPITOR) 10 MG tablet Take 1 tablet (10 mg total) by mouth daily.   citalopram (CELEXA) 10 MG tablet Take 1 tablet (10 mg total) by mouth in the morning.   Multiple Vitamins-Minerals (MULTIVITAMIN WITH MINERALS) tablet Take 1 tablet by mouth daily.   No facility-administered encounter medications on file as of 09/12/2020.    There are no problems to display for this patient.   Conditions to be addressed/monitored: Dementia; Mental Health Concerns , Memory Deficits, and Caregiver Stress  Care Plan : General Social Work (Adult)  Updates made by Erin Patrick Patrick, Erin Patrick since 09/13/2020 12:00 AM     Problem: Depression Identification (Depression)      Long-Range Goal: Management of Depression Symptoms   Start Date: 08/15/2020  This Visit's Progress: On track  Recent Progress: On track  Priority: High  Note:   Current barriers:   Acute Mental Health needs related to depression Mental Health Concerns  Needs Support, Education, and Care Coordination in order to meet unmet mental health needs. Clinical Goal(s): Over the next 120 days, patient will work with SW, counselor and therapist to reduce or manage symptoms of agitation, mood instability, stress, and bipolar until connected for ongoing counseling. Clinical Interventions:  Assessed patient's previous and current treatment, coping skills, support system and barriers to care  Patient's daughter, Erin Patrick, provided all information during visit Patient has difficulty managing depression symptoms States that patient's neurology appt "went fine" Patient completed MRI and was diagnosed with dementia Daughter shared difficulty getting patient to take medications. They are awaiting a call from psychiatry to schedule initial appointment CCM Erin Patrick informed daughter that PCP completed a referral to [facility] in June. Erin Patrick provided [facility]'s contact  information and Erin Patrick agreed to call and schedule appointment today Patient has a  Engineer, civil (consulting) from [place] that completes home visits occasionally  Patient receives strong support from family. She resides with daughter Patient may go grocery shopping with daughter, at times; however, she prefers to remain in the home Family are not interested in day programs or placement CCM Erin Patrick reviewed upcoming appointments Depression screen reviewed , Solution-Focused Strategies, Active listening / Reflection utilized , Emotional Supportive Provided, Reviewed mental health medications with patient and discussed compliance: Patient is compliant with medications, and Caregiver stress acknowledged   Discussed several options for long term counseling based on need and insurance. Daughter prefers to complete psychiatry appointment prior to referring for long-term counseling Collaboration with PCP regarding development and update of comprehensive plan of care as evidenced by provider attestation and co-signature Inter-disciplinary care team collaboration (see longitudinal plan of care) Patient Goals/Self-Care Activities: Over the next 120 days Attend all scheduled appointments with providers Contact clinic with any additional questions or concerns Continue compliance with medications Call [facility] to schedule your appointment for med management        Erin Patrick, MSW, Erin Patrick [facility] Care Management [facility]  [facility] [email or web address] Phone [phone] 12:42 PM

## 2020-09-13 NOTE — Patient Instructions (Signed)
Visit Information   Goals Addressed             This Visit's Progress    Depressive Symptoms Identified   On track    Patient Goals/Self-Care Activities: Over the next 120 days Attend all scheduled appointments with providers Contact clinic with any additional questions or concerns Continue compliance with medications Call [facility] to schedule your appointment for med management        Patient verbalizes understanding of instructions provided today.   Telephone follow up appointment with care management team member scheduled for:10/24/20  [name], MSW, LCSW [facility] Care Management [facility]  [facility] [email or web address] Phone [phone] 12:43 PM

## 2020-09-20 ENCOUNTER — Ambulatory Visit: Payer: Self-pay | Admitting: *Deleted

## 2020-09-20 ENCOUNTER — Emergency Department: Payer: Medicare PPO

## 2020-09-20 ENCOUNTER — Other Ambulatory Visit: Payer: Self-pay

## 2020-09-20 ENCOUNTER — Encounter: Payer: Self-pay | Admitting: Emergency Medicine

## 2020-09-20 ENCOUNTER — Emergency Department
Admission: EM | Admit: 2020-09-20 | Discharge: 2020-09-20 | Disposition: A | Discharge status: Home / Self Care | Payer: Medicare PPO | Attending: Student in an Organized Health Care Education/Training Program | Admitting: Student in an Organized Health Care Education/Training Program

## 2020-09-20 DIAGNOSIS — S59912A Unspecified injury of left forearm, initial encounter: Secondary | ICD-10-CM | POA: Diagnosis present

## 2020-09-20 DIAGNOSIS — M79602 Pain in left arm: Secondary | ICD-10-CM | POA: Diagnosis not present

## 2020-09-20 DIAGNOSIS — Z79899 Other long term (current) drug therapy: Secondary | ICD-10-CM | POA: Insufficient documentation

## 2020-09-20 DIAGNOSIS — E119 Type 2 diabetes mellitus without complications: Secondary | ICD-10-CM | POA: Diagnosis not present

## 2020-09-20 DIAGNOSIS — F0390 Unspecified dementia without behavioral disturbance: Secondary | ICD-10-CM | POA: Insufficient documentation

## 2020-09-20 DIAGNOSIS — S5012XA Contusion of left forearm, initial encounter: Secondary | ICD-10-CM | POA: Diagnosis not present

## 2020-09-20 DIAGNOSIS — S40022A Contusion of left upper arm, initial encounter: Secondary | ICD-10-CM

## 2020-09-20 DIAGNOSIS — X58XXXA Exposure to other specified factors, initial encounter: Secondary | ICD-10-CM | POA: Insufficient documentation

## 2020-09-20 DIAGNOSIS — M79622 Pain in left upper arm: Secondary | ICD-10-CM

## 2020-09-20 DIAGNOSIS — R079 Chest pain, unspecified: Secondary | ICD-10-CM | POA: Insufficient documentation

## 2020-09-20 LAB — BASIC METABOLIC PANEL
Anion gap: 9 (ref 5–15)
BUN: 11 mg/dL (ref 8–23)
CO2: 26 mmol/L (ref 22–32)
Calcium: 9.7 mg/dL (ref 8.9–10.3)
Chloride: 105 mmol/L (ref 98–111)
Creatinine, Ser: 0.94 mg/dL (ref 0.44–1.00)
GFR, Estimated: >60 mL/min (ref >60)
Glucose, Bld: 90 mg/dL (ref 70–99)
Potassium: 3.8 mmol/L (ref 3.5–5.1)
Sodium: 140 mmol/L (ref 135–145)

## 2020-09-20 LAB — CBC
HCT: 43.3 % (ref 36.0–46.0)
Hemoglobin: 14.5 g/dL (ref 12.0–15.0)
MCH: 28.5 pg (ref 26.0–34.0)
MCHC: 33.5 g/dL (ref 30.0–36.0)
MCV: 85.2 fL (ref 80.0–100.0)
Platelets: 290 K/uL (ref 150–400)
RBC: 5.08 MIL/uL (ref 3.87–5.11)
RDW: 13.8 % (ref 11.5–15.5)
WBC: 4.4 K/uL (ref 4.0–10.5)
nRBC: 0 % (ref 0.0–0.2)

## 2020-09-20 LAB — TROPONIN I (HIGH SENSITIVITY): Troponin I (High Sensitivity): 9 ng/L (ref <18)

## 2020-09-20 MED ORDER — ACETAMINOPHEN 325 MG PO TABS
650.0000 mg | ORAL_TABLET | Freq: Once | ORAL | Status: AC
  Administered 2020-09-20: 650 mg via ORAL
  Filled 2020-09-20: qty 2
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
## 2020-09-20 NOTE — ED Triage Notes (Signed)
First Nurse Note:  Arrives via [facility].  Patient from home.  Hx dementia. C/O left arm numbness and pain, intermittently x 6 months.  VS wnl.
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
## 2020-10-03 ENCOUNTER — Ambulatory Visit
Admission: RE | Admit: 2020-10-03 | Discharge: 2020-10-03 | Disposition: A | Discharge status: Home / Self Care | Payer: Medicare PPO | Source: Ambulatory Visit | Attending: Family Medicine | Admitting: Family Medicine

## 2020-10-03 ENCOUNTER — Other Ambulatory Visit: Payer: Self-pay

## 2020-10-03 ENCOUNTER — Ambulatory Visit
Admission: RE | Admit: 2020-10-03 | Discharge: 2020-10-03 | Disposition: A | Discharge status: Home / Self Care | Payer: Medicare PPO | Source: Home / Self Care | Attending: Family Medicine | Admitting: Family Medicine

## 2020-10-03 ENCOUNTER — Ambulatory Visit: Payer: Medicare PPO | Admitting: Family Medicine

## 2020-10-03 ENCOUNTER — Encounter: Payer: Self-pay | Admitting: Family Medicine

## 2020-10-03 VITALS — BP 119/71 | HR 82 | Temp 98.3°F | Ht 63.15 in | Wt 176.0 lb

## 2020-10-03 DIAGNOSIS — F4310 Post-traumatic stress disorder, unspecified: Secondary | ICD-10-CM

## 2020-10-03 DIAGNOSIS — M542 Cervicalgia: Secondary | ICD-10-CM | POA: Diagnosis not present

## 2020-10-03 DIAGNOSIS — E119 Type 2 diabetes mellitus without complications: Secondary | ICD-10-CM

## 2020-10-03 DIAGNOSIS — M25512 Pain in left shoulder: Secondary | ICD-10-CM | POA: Insufficient documentation

## 2020-10-03 DIAGNOSIS — F32A Depression, unspecified: Secondary | ICD-10-CM

## 2020-10-03 DIAGNOSIS — R413 Other amnesia: Secondary | ICD-10-CM

## 2020-10-03 DIAGNOSIS — M79602 Pain in left arm: Secondary | ICD-10-CM | POA: Diagnosis not present

## 2020-10-03 DIAGNOSIS — Z1329 Encounter for screening for other suspected endocrine disorder: Secondary | ICD-10-CM

## 2020-10-03 DIAGNOSIS — Z1159 Encounter for screening for other viral diseases: Secondary | ICD-10-CM

## 2020-10-03 DIAGNOSIS — Z136 Encounter for screening for cardiovascular disorders: Secondary | ICD-10-CM

## 2020-10-03 LAB — BAYER DCA HB A1C WAIVED: HB A1C (BAYER DCA - WAIVED): 5.5 % (ref <7.0)

## 2020-10-03 MED ORDER — NORTRIPTYLINE HCL 10 MG PO CAPS: 10.0000 mg | ORAL_CAPSULE | Freq: Every day | ORAL | 2 refills | Status: DC

## 2020-10-03 MED ORDER — DICLOFENAC SODIUM 1 % EX GEL: 4.0000 g | Freq: Four times a day (QID) | CUTANEOUS | 12 refills | Status: DC
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
## 2020-10-04 ENCOUNTER — Encounter: Payer: Self-pay | Admitting: Family Medicine

## 2020-10-04 LAB — DRUG SCREEN 764883 11+OXYCO+ALC+CRT-BUND
Amphetamines, Urine: NEGATIVE ng/mL
BENZODIAZ UR QL: NEGATIVE ng/mL
Barbiturate: NEGATIVE ng/mL
Cannabinoid Quant, Ur: NEGATIVE ng/mL
Cocaine (Metabolite): NEGATIVE ng/mL
Creatinine: 208.7 mg/dL (ref 20.0–300.0)
Ethanol: NEGATIVE %
Meperidine: NEGATIVE ng/mL
Methadone Screen, Urine: NEGATIVE ng/mL
OPIATE SCREEN URINE: NEGATIVE ng/mL
Oxycodone/Oxymorphone, Urine: NEGATIVE ng/mL
Phencyclidine: NEGATIVE ng/mL
Propoxyphene: NEGATIVE ng/mL
Tramadol: NEGATIVE ng/mL
pH, Urine: 5.1 (ref 4.5–8.9)

## 2020-10-04 LAB — ACUTE VIRAL HEPATITIS (HAV, HBV, HCV)
HCV Ab: <0.1 s/co ratio (ref 0.0–0.9)
Hep A IgM: NEGATIVE
Hep B C IgM: NEGATIVE
Hepatitis B Surface Ag: NEGATIVE

## 2020-10-04 LAB — HCV INTERPRETATION

## 2020-10-04 NOTE — Progress Notes (Signed)
lab

## 2020-10-05 ENCOUNTER — Encounter: Payer: Self-pay | Admitting: Family Medicine

## 2020-10-05 DIAGNOSIS — F4310 Post-traumatic stress disorder, unspecified: Secondary | ICD-10-CM

## 2020-10-05 HISTORY — DX: Post-traumatic stress disorder, unspecified: F43.10
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
## 2020-10-06 LAB — THYROID PANEL WITH TSH
Free Thyroxine Index: 2.4 (ref 1.2–4.9)
T3 Uptake Ratio: 27 % (ref 24–39)
T4, Total: 8.8 ug/dL (ref 4.5–12.0)
TSH: 1.68 u[IU]/mL (ref 0.450–4.500)

## 2020-10-06 LAB — CBC WITH DIFFERENTIAL/PLATELET
Basophils Absolute: 0 10*3/uL (ref 0.0–0.2)
Basos: 0 %
EOS (ABSOLUTE): 0.1 10*3/uL (ref 0.0–0.4)
Eos: 2 %
Hematocrit: 40.3 % (ref 34.0–46.6)
Hemoglobin: 13.2 g/dL (ref 11.1–15.9)
Immature Grans (Abs): 0 10*3/uL (ref 0.0–0.1)
Immature Granulocytes: 0 %
Lymphocytes Absolute: 2.4 10*3/uL (ref 0.7–3.1)
Lymphs: 48 %
MCH: 27.7 pg (ref 26.6–33.0)
MCHC: 32.8 g/dL (ref 31.5–35.7)
MCV: 85 fL (ref 79–97)
Monocytes Absolute: 0.3 10*3/uL (ref 0.1–0.9)
Monocytes: 7 %
Neutrophils Absolute: 2.1 10*3/uL (ref 1.4–7.0)
Neutrophils: 43 %
Platelets: 334 10*3/uL (ref 150–450)
RBC: 4.77 x10E6/uL (ref 3.77–5.28)
RDW: 13.5 % (ref 11.7–15.4)
WBC: 4.9 10*3/uL (ref 3.4–10.8)

## 2020-10-06 LAB — COMPREHENSIVE METABOLIC PANEL
ALT: 20 IU/L (ref 0–32)
AST: 23 IU/L (ref 0–40)
Albumin/Globulin Ratio: 1.8 (ref 1.2–2.2)
Albumin: 4.4 g/dL (ref 3.7–4.7)
Alkaline Phosphatase: 101 IU/L (ref 44–121)
BUN/Creatinine Ratio: 12 (ref 12–28)
BUN: 12 mg/dL (ref 8–27)
Bilirubin Total: <0.2 mg/dL (ref 0.0–1.2)
CO2: 20 mmol/L (ref 20–29)
Calcium: 9.4 mg/dL (ref 8.7–10.3)
Chloride: 108 mmol/L — ABNORMAL HIGH (ref 96–106)
Creatinine, Ser: 0.99 mg/dL (ref 0.57–1.00)
Globulin, Total: 2.4 g/dL (ref 1.5–4.5)
Glucose: 109 mg/dL — ABNORMAL HIGH (ref 65–99)
Potassium: 4.6 mmol/L (ref 3.5–5.2)
Sodium: 143 mmol/L (ref 134–144)
Total Protein: 6.8 g/dL (ref 6.0–8.5)
eGFR: 59 mL/min/{1.73_m2} — ABNORMAL LOW (ref >59)

## 2020-10-06 LAB — LIPID PANEL
Chol/HDL Ratio: 2.4 ratio (ref 0.0–4.4)
Cholesterol, Total: 169 mg/dL (ref 100–199)
HDL: 71 mg/dL (ref >39)
LDL Chol Calc (NIH): 83 mg/dL (ref 0–99)
Triglycerides: 78 mg/dL (ref 0–149)
VLDL Cholesterol Cal: 15 mg/dL (ref 5–40)

## 2020-10-06 LAB — HSV(HERPES SIMPLEX VRS) I + II AB-IGG
HSV 1 Glycoprotein G Ab, IgG: 29.5 index — ABNORMAL HIGH (ref 0.00–0.90)
HSV 2 IgG, Type Spec: >23.6 index — ABNORMAL HIGH (ref 0.00–0.90)

## 2020-10-06 LAB — HEPATITIS C ANTIBODY: Hep C Virus Ab: <0.1 s/co ratio (ref 0.0–0.9)

## 2020-10-06 LAB — HIV ANTIBODY (ROUTINE TESTING W REFLEX): HIV Screen 4th Generation wRfx: NONREACTIVE

## 2020-10-06 LAB — RPR: RPR Ser Ql: NONREACTIVE

## 2020-10-07 LAB — GC/CHLAMYDIA PROBE AMP
Chlamydia trachomatis, NAA: NEGATIVE
Neisseria Gonorrhoeae by PCR: NEGATIVE

## 2020-10-24 ENCOUNTER — Ambulatory Visit (INDEPENDENT_AMBULATORY_CARE_PROVIDER_SITE_OTHER): Payer: Medicare PPO | Admitting: Licensed Clinical Social Worker

## 2020-10-24 DIAGNOSIS — E119 Type 2 diabetes mellitus without complications: Secondary | ICD-10-CM

## 2020-10-24 DIAGNOSIS — M79602 Pain in left arm: Secondary | ICD-10-CM

## 2020-10-24 DIAGNOSIS — F0390 Unspecified dementia without behavioral disturbance: Secondary | ICD-10-CM

## 2020-10-24 DIAGNOSIS — F32A Depression, unspecified: Secondary | ICD-10-CM

## 2020-10-24 DIAGNOSIS — F4310 Post-traumatic stress disorder, unspecified: Secondary | ICD-10-CM
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
## 2020-10-24 NOTE — Patient Instructions (Signed)
Visit Information   Goals Addressed             This Visit's Progress    Depressive Symptoms Identified   On track    Patient Goals/Self-Care Activities: Over the next 120 days Attend all scheduled appointments with providers Contact clinic with any additional questions or concerns Continue compliance with medications Call [facility] to schedule your appointment for med management        Patient verbalizes understanding of instructions provided today.   Telephone follow up appointment with care management team member scheduled for:12/09/20  [name], MSW, LCSW [facility] Care Management [facility]  [facility] [email or web address] Phone [phone] 4:33 PM

## 2020-10-29 ENCOUNTER — Ambulatory Visit: Payer: Medicare PPO | Admitting: Internal Medicine

## 2020-10-30 ENCOUNTER — Ambulatory Visit: Payer: Medicare PPO | Admitting: Internal Medicine

## 2020-10-30 ENCOUNTER — Ambulatory Visit (INDEPENDENT_AMBULATORY_CARE_PROVIDER_SITE_OTHER): Payer: Medicare PPO

## 2020-10-30 DIAGNOSIS — Z0000 Encounter for general adult medical examination without abnormal findings: Secondary | ICD-10-CM

## 2020-10-30 NOTE — Patient Instructions (Signed)
Health Maintenance, Female Adopting a healthy lifestyle and getting preventive care are important in promoting health and wellness. Ask your health care provider about: The right schedule for you to have regular tests and exams. Things you can do on your own to prevent diseases and keep yourself healthy. What should I know about diet, weight, and exercise? Eat a healthy diet  Eat a diet that includes plenty of vegetables, fruits, low-fat dairy products, and lean protein. Do not eat a lot of foods that are high in solid fats, added sugars, or sodium. Maintain a healthy weight Body mass index (BMI) is used to identify weight problems. It estimates body fat based on height and weight. Your health care provider can help determine your BMI and help you achieve or maintain a healthy weight. Get regular exercise Get regular exercise. This is one of the most important things you can do for your health. Most adults should: Exercise for at least 150 minutes each week. The exercise should increase your heart rate and make you sweat (moderate-intensity exercise). Do strengthening exercises at least twice a week. This is in addition to the moderate-intensity exercise. Spend less time sitting. Even light physical activity can be beneficial. Watch cholesterol and blood lipids Have your blood tested for lipids and cholesterol at 77 years of age, then have this test every 5 years. Have your cholesterol levels checked more often if: Your lipid or cholesterol levels are high. You are older than 77 years of age. You are at high risk for heart disease. What should I know about cancer screening? Depending on your health history and family history, you may need to have cancer screening at various ages. This may include screening for: Breast cancer. Cervical cancer. Colorectal cancer. Skin cancer. Lung cancer. What should I know about heart disease, diabetes, and high blood pressure? Blood pressure and heart  disease High blood pressure causes heart disease and increases the risk of stroke. This is more likely to develop in people who have high blood pressure readings, are of African descent, or are overweight. Have your blood pressure checked: Every 3-5 years if you are 18-39 years of age. Every year if you are 40 years old or older. Diabetes Have regular diabetes screenings. This checks your fasting blood sugar level. Have the screening done: Once every three years after age 40 if you are at a normal weight and have a low risk for diabetes. More often and at a younger age if you are overweight or have a high risk for diabetes. What should I know about preventing infection? Hepatitis B If you have a higher risk for hepatitis B, you should be screened for this virus. Talk with your health care provider to find out if you are at risk for hepatitis B infection. Hepatitis C Testing is recommended for: Everyone born from 1945 through 1965. Anyone with known risk factors for hepatitis C. Sexually transmitted infections (STIs) Get screened for STIs, including gonorrhea and chlamydia, if: You are sexually active and are younger than 77 years of age. You are older than 77 years of age and your health care provider tells you that you are at risk for this type of infection. Your sexual activity has changed since you were last screened, and you are at increased risk for chlamydia or gonorrhea. Ask your health care provider if you are at risk. Ask your health care provider about whether you are at high risk for HIV. Your health care provider may recommend a prescription medicine   to help prevent HIV infection. If you choose to take medicine to prevent HIV, you should first get tested for HIV. You should then be tested every 3 months for as long as you are taking the medicine. Pregnancy If you are about to stop having your period (premenopausal) and you may become pregnant, seek counseling before you get  pregnant. Take 400 to 800 micrograms (mcg) of folic acid every day if you become pregnant. Ask for birth control (contraception) if you want to prevent pregnancy. Osteoporosis and menopause Osteoporosis is a disease in which the bones lose minerals and strength with aging. This can result in bone fractures. If you are 65 years old or older, or if you are at risk for osteoporosis and fractures, ask your health care provider if you should: Be screened for bone loss. Take a calcium or vitamin D supplement to lower your risk of fractures. Be given hormone replacement therapy (HRT) to treat symptoms of menopause. Follow these instructions at home: Lifestyle Do not use any products that contain nicotine or tobacco, such as cigarettes, e-cigarettes, and chewing tobacco. If you need help quitting, ask your health care provider. Do not use street drugs. Do not share needles. Ask your health care provider for help if you need support or information about quitting drugs. Alcohol use Do not drink alcohol if: Your health care provider tells you not to drink. You are pregnant, may be pregnant, or are planning to become pregnant. If you drink alcohol: Limit how much you use to 0-1 drink a day. Limit intake if you are breastfeeding. Be aware of how much alcohol is in your drink. In the [place] one drink equals one 12 oz bottle of beer (355 mL), one 5 oz glass of wine (148 mL), or one 1 oz glass of hard liquor (44 mL). General instructions Schedule regular health, dental, and eye exams. Stay current with your vaccines. Tell your health care provider if: You often feel depressed. You have ever been abused or do not feel safe at home. Summary Adopting a healthy lifestyle and getting preventive care are important in promoting health and wellness. Follow your health care provider's instructions about healthy diet, exercising, and getting tested or screened for diseases. Follow your health care provider's  instructions on monitoring your cholesterol and blood pressure. This information is not intended to replace advice given to you by your health care provider. Make sure you discuss any questions you have with your health care provider. Document Revised: 04/12/2020 Document Reviewed: 01/26/2018 Elsevier Patient Education  2022 Elsevier Inc.  

## 2020-10-30 NOTE — Progress Notes (Signed)
Subjective:   Juliene Kirsh is a 75 y.o. female who presents for an Initial Medicare Annual Wellness Visit.I connected with  Tana Conch on 10/30/20 by a audio enabled telemedicine application and verified that I am speaking with the correct person using two identifiers.   I discussed the limitations of evaluation and management by telemedicine. The patient expressed understanding and agreed to proceed.   Location of patient : home Location of provider: office  Review of Systems    Defer to PCP       Objective:    There were no vitals filed for this visit. There is no height or weight on file to calculate BMI.  Advanced Directives 09/20/2020 08/16/2020  Does Patient Have a Medical Advance Directive? No Yes  Type of Advance Directive - Healthcare Power of Attorney    Current Medications (verified) Outpatient Encounter Medications as of 10/30/2020  Medication Sig   atorvastatin (LIPITOR) 10 MG tablet Take 1 tablet (10 mg total) by mouth daily.   diclofenac Sodium (VOLTAREN) 1 % GEL Apply 4 g topically 4 (four) times daily.   Multiple Vitamins-Minerals (MULTIVITAMIN WITH MINERALS) tablet Take 1 tablet by mouth daily.   nortriptyline (PAMELOR) 10 MG capsule Take 1 capsule (10 mg total) by mouth at bedtime.   citalopram (CELEXA) 10 MG tablet Take 1 tablet (10 mg total) by mouth in the morning. (Patient not taking: Reported on 10/30/2020)   No facility-administered encounter medications on file as of 10/30/2020.    Allergies (verified) Penicillins   History: Past Medical History:  Diagnosis Date   Dementia (HCC)    Diabetes mellitus without complication (HCC)    Past Surgical History:  Procedure Laterality Date   ABDOMINAL HYSTERECTOMY     Family History  Problem Relation Age of Onset   Diabetes Mother    Social History   Socioeconomic History   Marital status: Single    Spouse name: Not on file   Number of children: Not on file   Years of education: Not on file    Highest education level: Not on file  Occupational History   Not on file  Tobacco Use   Smoking status: Never   Smokeless tobacco: Never  Vaping Use   Vaping Use: Never used  Substance and Sexual Activity   Alcohol use: Not Currently   Drug use: Never   Sexual activity: Not Currently  Other Topics Concern   Not on file  Social History Narrative   Right hand   Lives with family    Social Determinants of Health   Financial Resource Strain: Low Risk    Difficulty of Paying Living Expenses: Not hard at all  Food Insecurity: No Food Insecurity   Worried About Programme researcher, broadcasting/film/video in the Last Year: Never true   Ran Out of Food in the Last Year: Never true  Transportation Needs: No Transportation Needs   Lack of Transportation (Medical): No   Lack of Transportation (Non-Medical): No  Physical Activity: Insufficiently Active   Days of Exercise per Week: 3 days   Minutes of Exercise per Session: 20 min  Stress: No Stress Concern Present   Feeling of Stress : Not at all  Social Connections: Unknown   Frequency of Communication with Friends and Family: Once a week   Frequency of Social Gatherings with Friends and Family: Once a week   Attends Religious Services: Never   Database administrator or Organizations: No   Attends Banker Meetings: Never  Marital Status: Not on file    Tobacco Counseling Counseling given: Yes   Clinical Intake:  Pre-visit preparation completed: Yes  Pain : No/denies pain     Nutritional Risks: None Diabetes: Yes CBG done?: No Did pt. bring in CBG monitor from home?: No  How often do you need to have someone help you when you read instructions, pamphlets, or other written materials from your doctor or pharmacy?: 1 - Never What is the last grade level you completed in school?: 12th  Diabetic?yes  Interpreter Needed?: No      Activities of Daily Living In your present state of health, do you have any difficulty performing  the following activities: 08/05/2020  Hearing? N  Vision? Y  Difficulty concentrating or making decisions? Y  Walking or climbing stairs? N  Dressing or bathing? N  Doing errands, shopping? N    Patient Care Team: Loura Pardon, MD as PCP - General (Internal Medicine) Bridgett Larsson, LCSW as Social Worker (Licensed Clinical Social Worker) Gwynneth Munson, Corrie Dandy (Neurology)  Indicate any recent Medical Services you may have received from other than [facility] providers in the past year (date may be approximate).     Assessment:   This is a routine wellness examination for Varina.  Hearing/Vision screen No results found.  Dietary issues and exercise activities discussed: Current Exercise Habits: The patient does not participate in regular exercise at present, Exercise limited by: None identified   Goals Addressed   None    Depression Screen PHQ 2/9 Scores 10/30/2020 10/30/2020 08/05/2020  PHQ - 2 Score 0 0 6  PHQ- 9 Score 1 - 6    Fall Risk Fall Risk  10/30/2020 08/16/2020 08/05/2020  Falls in the past year? 0 1 0  Number falls in past yr: 0 1 1  Injury with Fall? 0 0 1  Risk for fall due to : History of fall(s) - No Fall Risks  Follow up Falls evaluation completed - -    FALL RISK PREVENTION PERTAINING TO THE HOME:  Any stairs in or around the home? No  If so, are there any without handrails? No  Home free of loose throw rugs in walkways, pet beds, electrical cords, etc? Yes  Adequate lighting in your home to reduce risk of falls? Yes   ASSISTIVE DEVICES UTILIZED TO PREVENT FALLS:  Life alert? No  Use of a cane, Daysha Ashmore or w/c? No  Grab bars in the bathroom? No  Shower chair or bench in shower? No  Elevated toilet seat or a handicapped toilet? No   TIMED UP AND GO:  Was the test performed?  N/A .  Length of time to ambulate 10 feet: N/A sec.     Cognitive Function:   Montreal Cognitive Assessment  08/16/2020  Visuospatial/ Executive (0/5) 2  Naming (0/3) 2   Attention: Read list of digits (0/2) 2  Attention: Read list of letters (0/1) 1  Attention: Serial 7 subtraction starting at 100 (0/3) 1  Language: Repeat phrase (0/2) 1  Language : Fluency (0/1) 1  Abstraction (0/2) 0  Delayed Recall (0/5) 0  Orientation (0/6) 2  Total 12  Adjusted Score (based on education) 13   6CIT Screen 10/30/2020 10/30/2020  What Year? 0 points 0 points  What month? 0 points 0 points  What time? 0 points 0 points  Count back from 20 0 points 0 points  Months in reverse 0 points 0 points  Repeat phrase 0 points 0 points  Total  Score 0 0    Immunizations Immunization History  Administered Date(s) Administered   PFIZER(Purple Top)SARS-COV-2 Vaccination 02/01/2020, 02/22/2020    TDAP status: Due, Education has been provided regarding the importance of this vaccine. Advised may receive this vaccine at local pharmacy or Health Dept. Aware to provide a copy of the vaccination record if obtained from local pharmacy or Health Dept. Verbalized acceptance and understanding.  Flu Vaccine status: Due, Education has been provided regarding the importance of this vaccine. Advised may receive this vaccine at local pharmacy or Health Dept. Aware to provide a copy of the vaccination record if obtained from local pharmacy or Health Dept. Verbalized acceptance and understanding.  Pneumococcal vaccine status: Due, Education has been provided regarding the importance of this vaccine. Advised may receive this vaccine at local pharmacy or Health Dept. Aware to provide a copy of the vaccination record if obtained from local pharmacy or Health Dept. Verbalized acceptance and understanding.  Covid-19 vaccine status: Completed vaccines  Qualifies for Shingles Vaccine? Yes   Zostavax completed No   Shingrix Completed?: No.    Education has been provided regarding the importance of this vaccine. Patient has been advised to call insurance company to determine out of pocket expense if  they have not yet received this vaccine. Advised may also receive vaccine at local pharmacy or Health Dept. Verbalized acceptance and understanding.  Screening Tests Health Maintenance  Topic Date Due   URINE MICROALBUMIN  Never done   TETANUS/TDAP  Never done   Zoster Vaccines- Shingrix (1 of 2) Never done   DEXA SCAN  Never done   COVID-19 Vaccine (3 - Booster for Pfizer series) 07/22/2020   INFLUENZA VACCINE  09/16/2020   Hepatitis C Screening  Completed   PNA vac Low Risk Adult  Completed   HPV VACCINES  Aged Out    Health Maintenance  Health Maintenance Due  Topic Date Due   URINE MICROALBUMIN  Never done   TETANUS/TDAP  Never done   Zoster Vaccines- Shingrix (1 of 2) Never done   DEXA SCAN  Never done   COVID-19 Vaccine (3 - Booster for Pfizer series) 07/22/2020   INFLUENZA VACCINE  09/16/2020    Colorectal cancer screening: No longer required.   Mammogram status: Completed  . Repeat every year  Bone Density status: Ordered  . Pt provided with contact info and advised to call to schedule appt.  Lung Cancer Screening: (Low Dose CT Chest recommended if Age 77-80 years, 30 pack-year currently smoking OR have quit w/in 15years.) does not qualify.   Lung Cancer Screening Referral: N/A  Additional Screening:  Hepatitis C Screening: does not qualify; Completed   Vision Screening: Recommended annual ophthalmology exams for early detection of glaucoma and other disorders of the eye. Is the patient up to date with their annual eye exam?  Yes  Who is the provider or what is the name of the office in which the patient attends annual eye exams? Needs new provider If pt is not established with a provider, would they like to be referred to a provider to establish care? No .   Dental Screening: Recommended annual dental exams for proper oral hygiene  Community Resource Referral / Chronic Care Management: CRR required this visit?  No   CCM required this visit?  No       Plan:     I have personally reviewed and noted the following in the patient's chart:   Medical and social history Use of alcohol, tobacco  or illicit drugs  Current medications and supplements including opioid prescriptions. Patient is not currently taking opioid prescriptions. Functional ability and status Nutritional status Physical activity Advanced directives List of other physicians Hospitalizations, surgeries, and ER visits in previous 12 months Vitals Screenings to include cognitive, depression, and falls Referrals and appointments  In addition, I have reviewed and discussed with patient certain preventive protocols, quality metrics, and best practice recommendations. A written personalized care plan for preventive services as well as general preventive health recommendations were provided to patient.     Knute Neu, CMA   10/30/2020   Nurse Notes: non face to face 60 minutes   Ms. Rusty Aus , Thank you for taking time to come for your Medicare Wellness Visit. I appreciate your ongoing commitment to your health goals. Please review the following plan we discussed and let me know if I can assist you in the future.   These are the goals we discussed:  Goals      Depressive Symptoms Identified     Patient Goals/Self-Care Activities: Over the next 120 days Attend all scheduled appointments with providers Contact clinic with any additional questions or concerns Continue compliance with medications Call [facility] to schedule your appointment for med management        This is a list of the screening recommended for you and due dates:  Health Maintenance  Topic Date Due   Urine Protein Check  Never done   Tetanus Vaccine  Never done   Zoster (Shingles) Vaccine (1 of 2) Never done   DEXA scan (bone density measurement)  Never done   COVID-19 Vaccine (3 - Booster for Pfizer series) 07/22/2020   Flu Shot  09/16/2020   Hepatitis C Screening: USPSTF Recommendation to screen -  Ages 18-79 yo.  Completed   Pneumonia vaccines  Completed   HPV Vaccine  Aged Out

## 2020-11-02 ENCOUNTER — Ambulatory Visit: Payer: Medicare PPO

## 2020-11-04 ENCOUNTER — Other Ambulatory Visit: Payer: Self-pay

## 2020-11-04 ENCOUNTER — Encounter: Payer: Self-pay | Admitting: Internal Medicine

## 2020-11-04 ENCOUNTER — Ambulatory Visit: Payer: Medicare PPO | Admitting: Internal Medicine

## 2020-11-04 VITALS — BP 124/73 | HR 78 | Temp 98.3°F | Wt 177.8 lb

## 2020-11-04 DIAGNOSIS — M47812 Spondylosis without myelopathy or radiculopathy, cervical region: Secondary | ICD-10-CM

## 2020-11-04 DIAGNOSIS — F32A Depression, unspecified: Secondary | ICD-10-CM

## 2020-11-04 DIAGNOSIS — F329 Major depressive disorder, single episode, unspecified: Secondary | ICD-10-CM

## 2020-11-04 DIAGNOSIS — M79602 Pain in left arm: Secondary | ICD-10-CM

## 2020-11-04 DIAGNOSIS — G8929 Other chronic pain: Secondary | ICD-10-CM | POA: Insufficient documentation

## 2020-11-04 HISTORY — DX: Spondylosis without myelopathy or radiculopathy, cervical region: M47.812

## 2020-11-04 HISTORY — DX: Pain in left arm: M79.602

## 2020-11-04 HISTORY — DX: Major depressive disorder, single episode, unspecified: F32.9

## 2020-11-04 MED ORDER — CITALOPRAM HYDROBROMIDE 10 MG PO TABS: 10.0000 mg | ORAL_TABLET | Freq: Every morning | ORAL | 1 refills | Status: DC

## 2020-11-04 MED ORDER — CITALOPRAM HYDROBROMIDE 20 MG PO TABS: 20.0000 mg | ORAL_TABLET | Freq: Every morning | ORAL | 5 refills | Status: DC

## 2020-11-04 MED ORDER — TRAMADOL HCL 50 MG PO TABS: 50.0000 mg | ORAL_TABLET | Freq: Two times a day (BID) | ORAL | 0 refills | Status: AC | PRN
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
## 2020-11-13 ENCOUNTER — Ambulatory Visit: Payer: Medicare PPO | Attending: Internal Medicine

## 2020-11-13 ENCOUNTER — Telehealth: Payer: Self-pay

## 2020-11-13 ENCOUNTER — Ambulatory Visit: Payer: Self-pay | Admitting: Licensed Clinical Social Worker

## 2020-11-13 DIAGNOSIS — E119 Type 2 diabetes mellitus without complications: Secondary | ICD-10-CM

## 2020-11-13 DIAGNOSIS — F4310 Post-traumatic stress disorder, unspecified: Secondary | ICD-10-CM

## 2020-11-13 DIAGNOSIS — F32A Depression, unspecified: Secondary | ICD-10-CM

## 2020-11-13 DIAGNOSIS — F0390 Unspecified dementia without behavioral disturbance: Secondary | ICD-10-CM

## 2020-11-13 NOTE — Telephone Encounter (Signed)
Copied from CRM [id number]. Topic: General - Other >> Nov 13, 2020 11:53 AM Gaetana Michaelis A wrote: Reason for CRM: Patient's daughter would like to be contacted by staff member Jasmine L regarding "options" for the patient   Please contact further when possible
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
## 2020-11-14 NOTE — Patient Instructions (Signed)
Visit Information   Goals Addressed             This Visit's Progress    Depressive Symptoms Identified   On track    Patient Goals/Self-Care Activities: Over the next 120 days Attend all scheduled appointments with providers Contact clinic with any additional questions or concerns Continue compliance with medications Call [facility] to schedule your appointment for med management        Patient verbalizes understanding of instructions provided today.   Telephone follow up appointment with care management team member scheduled for:11/21/20  [name], MSW, LCSW [facility] Care Management [facility]  [facility] [email or web address] Phone [phone] 3:01 PM

## 2020-11-15 DIAGNOSIS — F0390 Unspecified dementia without behavioral disturbance: Secondary | ICD-10-CM | POA: Diagnosis not present

## 2020-11-15 DIAGNOSIS — E119 Type 2 diabetes mellitus without complications: Secondary | ICD-10-CM

## 2020-11-15 DIAGNOSIS — F32A Depression, unspecified: Secondary | ICD-10-CM | POA: Diagnosis not present

## 2020-11-18 ENCOUNTER — Ambulatory Visit (INDEPENDENT_AMBULATORY_CARE_PROVIDER_SITE_OTHER): Payer: Medicare PPO | Admitting: Licensed Clinical Social Worker

## 2020-11-18 DIAGNOSIS — F4310 Post-traumatic stress disorder, unspecified: Secondary | ICD-10-CM

## 2020-11-18 DIAGNOSIS — F32A Depression, unspecified: Secondary | ICD-10-CM

## 2020-11-18 DIAGNOSIS — M47812 Spondylosis without myelopathy or radiculopathy, cervical region: Secondary | ICD-10-CM

## 2020-11-18 NOTE — Patient Instructions (Signed)
Visit Information   Goals Addressed             This Visit's Progress    Depressive Symptoms Identified   On track    Patient Goals/Self-Care Activities: Over the next 120 days Attend all scheduled appointments with providers Contact clinic with any additional questions or concerns Continue compliance with medications Call [facility] to schedule your appointment for med management        Patient verbalizes understanding of instructions provided today.   Telephone follow up appointment with care management team member scheduled for:12/09/20  [name], MSW, LCSW [facility] Care Management [facility]  [facility] [email or web address] Phone [phone] 4:29 PM
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
## 2020-11-21 ENCOUNTER — Telehealth: Payer: Medicare PPO

## 2020-11-21 ENCOUNTER — Ambulatory Visit (INDEPENDENT_AMBULATORY_CARE_PROVIDER_SITE_OTHER): Payer: Medicare PPO | Admitting: Internal Medicine

## 2020-11-21 ENCOUNTER — Other Ambulatory Visit: Payer: Self-pay

## 2020-11-21 ENCOUNTER — Encounter: Payer: Self-pay | Admitting: Internal Medicine

## 2020-11-21 VITALS — BP 119/68 | HR 82 | Temp 97.9°F | Ht 63.15 in | Wt 177.0 lb

## 2020-11-21 DIAGNOSIS — J3489 Other specified disorders of nose and nasal sinuses: Secondary | ICD-10-CM

## 2020-11-21 DIAGNOSIS — R519 Headache, unspecified: Secondary | ICD-10-CM | POA: Insufficient documentation

## 2020-11-21 DIAGNOSIS — H9203 Otalgia, bilateral: Secondary | ICD-10-CM

## 2020-11-21 HISTORY — DX: Headache, unspecified: R51.9

## 2020-11-21 HISTORY — DX: Other specified disorders of nose and nasal sinuses: J34.89

## 2020-11-21 HISTORY — DX: Otalgia, bilateral: H92.03

## 2020-11-21 MED ORDER — FEXOFENADINE HCL 180 MG PO TABS: 180.0000 mg | ORAL_TABLET | Freq: Every day | ORAL | 1 refills | Status: DC

## 2020-11-21 NOTE — Progress Notes (Signed)
BP 119/68   Pulse 82   Temp 97.9 F (36.6 C) (Oral)   Ht 5' 3.15" (1.604 m)   Wt 177 lb (80.3 kg)   LMP  (LMP Unknown)   SpO2 99%   BMI 31.21 kg/m    Subjective:    Patient ID: Erin Patrick, female    DOB: 1945-12-23, 75 y.o.   MRN: [id number]  Chief Complaint  Patient presents with   Shortness of Breath    Nasal drainage, runny eyes. For the past few days. With a lot of sinus pressure, ear pain     HPI: Erin Patrick is a 75 y.o. female  Pt is here for a URI   URI  This is a new problem. There has been no fever. The fever has been present for 5 days or more. Associated symptoms include congestion, coughing, a plugged ear sensation, rhinorrhea and a sore throat. Pertinent negatives include no abdominal pain, chest pain, diarrhea, dysuria, ear pain, headaches, joint pain, joint swelling, nausea, neck pain, rash, sinus pain, sneezing, swollen glands, vomiting or wheezing. Associated symptoms comments: Nasal discharge  not yellow.   Chief Complaint  Patient presents with   Shortness of Breath    Nasal drainage, runny eyes. For the past few days. With a lot of sinus pressure, ear pain     Relevant past medical, surgical, family and social history reviewed and updated as indicated. Interim medical history since our last visit reviewed. Allergies and medications reviewed and updated.  Review of Systems  HENT:  Positive for congestion, rhinorrhea and sore throat. Negative for ear pain, sinus pain and sneezing.   Respiratory:  Positive for cough. Negative for wheezing.   Cardiovascular:  Negative for chest pain.  Gastrointestinal:  Negative for abdominal pain, diarrhea, nausea and vomiting.  Genitourinary:  Negative for dysuria.  Musculoskeletal:  Negative for joint pain and neck pain.  Skin:  Negative for rash.  Neurological:  Negative for headaches.   Per HPI unless specifically indicated above     Objective:    BP 119/68   Pulse 82   Temp 97.9 F (36.6 C) (Oral)    Ht 5' 3.15" (1.604 m)   Wt 177 lb (80.3 kg)   LMP  (LMP Unknown)   SpO2 99%   BMI 31.21 kg/m   Wt Readings from Last 3 Encounters:  11/21/20 177 lb (80.3 kg)  11/04/20 177 lb 12.8 oz (80.6 kg)  10/03/20 176 lb (79.8 kg)    Physical Exam Vitals and nursing note reviewed.  Constitutional:      General: She is not in acute distress.    Appearance: Normal appearance. She is not ill-appearing or diaphoretic.  HENT:     Head: Normocephalic.     Right Ear: Tympanic membrane, ear canal and external ear normal.     Left Ear: Tympanic membrane, ear canal and external ear normal. There is no impacted cerumen.     Nose: Congestion present. No rhinorrhea.     Mouth/Throat:     Pharynx: No oropharyngeal exudate or posterior oropharyngeal erythema.  Eyes:     Extraocular Movements: Extraocular movements intact.     Conjunctiva/sclera: Conjunctivae normal.     Pupils: Pupils are equal, round, and reactive to light.  Cardiovascular:     Rate and Rhythm: Normal rate and regular rhythm.  Pulmonary:     Effort: Pulmonary effort is normal. No tachypnea.     Breath sounds: Normal breath sounds. No rhonchi.  Abdominal:  General: Abdomen is flat. Bowel sounds are normal. There is no distension.     Palpations: Abdomen is soft. There is no mass.     Tenderness: There is no abdominal tenderness. There is no guarding.  Skin:    General: Skin is warm and dry.     Coloration: Skin is not jaundiced.     Findings: No erythema.  Neurological:     Mental Status: She is alert.          Current Outpatient Medications:    atorvastatin (LIPITOR) 10 MG tablet, Take 1 tablet (10 mg total) by mouth daily., Disp: 30 tablet, Rfl: 3   citalopram (CELEXA) 20 MG tablet, Take 1 tablet (20 mg total) by mouth in the morning., Disp: 30 tablet, Rfl: 5   diclofenac Sodium (VOLTAREN) 1 % GEL, Apply 4 g topically 4 (four) times daily., Disp: 100 g, Rfl: 12   nortriptyline (PAMELOR) 10 MG capsule, Take 1 capsule  (10 mg total) by mouth at bedtime., Disp: 30 capsule, Rfl: 2    Assessment & Plan:  URI: Flu , COVID and strep ordered at this visit, start pt on allegra  pt advised to take Tylenol q 4- 6 hourly as needed. pt to take allegra q pm as needed and to call office if symptoms worsened pt verbalised understanding of such.     Problem List Items Addressed This Visit   None Visit Diagnoses     Ear pain, bilateral    -  Primary   Relevant Orders   DG Bone Density   Novel Coronavirus, NAA ([facility])   Rapid Strep screen([facility])   Veritor Flu A/B Waived   Sinus pressure       Relevant Orders   DG Bone Density   Novel Coronavirus, NAA ([facility])   Rapid Strep screen([facility])   Veritor Flu A/B Waived   Headache above the eye region       Relevant Orders   DG Bone Density   Novel Coronavirus, NAA ([facility])   Rapid Strep screen([facility])   Veritor Flu A/B Waived        Orders Placed This Encounter  Procedures   Novel Coronavirus, NAA ([facility])   Rapid Strep screen([facility])   DG Bone Density   Veritor Flu A/B Waived     No orders of the defined types were placed in this encounter.    Follow up plan: No follow-ups on file.

## 2020-11-23 LAB — NOVEL CORONAVIRUS, NAA: SARS-CoV-2, NAA: NOT DETECTED

## 2020-11-23 LAB — SARS-COV-2, NAA 2 DAY TAT

## 2020-11-25 LAB — VERITOR FLU A/B WAIVED
Influenza A: NEGATIVE
Influenza B: NEGATIVE

## 2020-11-25 LAB — CULTURE, GROUP A STREP: Strep A Culture: NEGATIVE

## 2020-11-25 LAB — RAPID STREP SCREEN (MED CTR MEBANE ONLY): Strep Gp A Ag, IA W/Reflex: NEGATIVE

## 2020-11-26 NOTE — Progress Notes (Signed)
Please let pt know this was normal.

## 2020-11-27 ENCOUNTER — Ambulatory Visit: Payer: Medicare PPO | Admitting: Internal Medicine

## 2020-11-28 ENCOUNTER — Other Ambulatory Visit: Payer: Self-pay

## 2020-11-28 ENCOUNTER — Other Ambulatory Visit: Payer: Medicare PPO

## 2020-11-28 DIAGNOSIS — Z1329 Encounter for screening for other suspected endocrine disorder: Secondary | ICD-10-CM

## 2020-11-28 DIAGNOSIS — F32A Depression, unspecified: Secondary | ICD-10-CM

## 2020-11-28 DIAGNOSIS — E119 Type 2 diabetes mellitus without complications: Secondary | ICD-10-CM

## 2020-11-28 DIAGNOSIS — Z136 Encounter for screening for cardiovascular disorders: Secondary | ICD-10-CM

## 2020-11-28 LAB — BAYER DCA HB A1C WAIVED: HB A1C (BAYER DCA - WAIVED): 5.6 % (ref 4.8–5.6)

## 2020-11-29 ENCOUNTER — Other Ambulatory Visit: Payer: Medicare PPO

## 2020-11-29 LAB — CBC WITH DIFFERENTIAL/PLATELET
Basophils Absolute: 0 10*3/uL (ref 0.0–0.2)
Basos: 1 %
EOS (ABSOLUTE): 0.1 10*3/uL (ref 0.0–0.4)
Eos: 2 %
Hematocrit: 44.4 % (ref 34.0–46.6)
Hemoglobin: 14.3 g/dL (ref 11.1–15.9)
Immature Grans (Abs): 0 10*3/uL (ref 0.0–0.1)
Immature Granulocytes: 0 %
Lymphocytes Absolute: 2.4 10*3/uL (ref 0.7–3.1)
Lymphs: 55 %
MCH: 27.7 pg (ref 26.6–33.0)
MCHC: 32.2 g/dL (ref 31.5–35.7)
MCV: 86 fL (ref 79–97)
Monocytes Absolute: 0.4 10*3/uL (ref 0.1–0.9)
Monocytes: 8 %
Neutrophils Absolute: 1.5 10*3/uL (ref 1.4–7.0)
Neutrophils: 34 %
Platelets: 283 10*3/uL (ref 150–450)
RBC: 5.16 x10E6/uL (ref 3.77–5.28)
RDW: 13.9 % (ref 11.7–15.4)
WBC: 4.3 10*3/uL (ref 3.4–10.8)

## 2020-11-29 LAB — COMPREHENSIVE METABOLIC PANEL
ALT: 17 IU/L (ref 0–32)
AST: 29 IU/L (ref 0–40)
Albumin/Globulin Ratio: 1.8 (ref 1.2–2.2)
Albumin: 4.4 g/dL (ref 3.7–4.7)
Alkaline Phosphatase: 107 IU/L (ref 44–121)
BUN/Creatinine Ratio: 11 — ABNORMAL LOW (ref 12–28)
BUN: 10 mg/dL (ref 8–27)
Bilirubin Total: 0.2 mg/dL (ref 0.0–1.2)
CO2: 22 mmol/L (ref 20–29)
Calcium: 9.6 mg/dL (ref 8.7–10.3)
Chloride: 103 mmol/L (ref 96–106)
Creatinine, Ser: 0.94 mg/dL (ref 0.57–1.00)
Globulin, Total: 2.5 g/dL (ref 1.5–4.5)
Glucose: 92 mg/dL (ref 70–99)
Potassium: 5.2 mmol/L (ref 3.5–5.2)
Sodium: 144 mmol/L (ref 134–144)
Total Protein: 6.9 g/dL (ref 6.0–8.5)
eGFR: 62 mL/min/{1.73_m2} (ref >59)

## 2020-11-29 LAB — THYROID PANEL WITH TSH
Free Thyroxine Index: 2.1 (ref 1.2–4.9)
T3 Uptake Ratio: 24 % (ref 24–39)
T4, Total: 8.8 ug/dL (ref 4.5–12.0)
TSH: 2.12 u[IU]/mL (ref 0.450–4.500)

## 2020-11-29 LAB — LIPID PANEL
Chol/HDL Ratio: 3.3 ratio (ref 0.0–4.4)
Cholesterol, Total: 209 mg/dL — ABNORMAL HIGH (ref 100–199)
HDL: 64 mg/dL (ref >39)
LDL Chol Calc (NIH): 122 mg/dL — ABNORMAL HIGH (ref 0–99)
Triglycerides: 131 mg/dL (ref 0–149)
VLDL Cholesterol Cal: 23 mg/dL (ref 5–40)

## 2020-12-02 ENCOUNTER — Ambulatory Visit (INDEPENDENT_AMBULATORY_CARE_PROVIDER_SITE_OTHER): Payer: Medicare PPO | Admitting: Psychology

## 2020-12-02 ENCOUNTER — Telehealth: Payer: Self-pay | Admitting: Physician Assistant

## 2020-12-02 DIAGNOSIS — R413 Other amnesia: Secondary | ICD-10-CM

## 2020-12-02 NOTE — Progress Notes (Signed)
   Neuropsychology Note [place]. [facility] Department of Neurology     Erin Patrick was scheduled to complete a comprehensive neuropsychological evaluation on the morning of 12/02/2020. Unfortunately, records suggest that her daughter was unable to convince her to attend her scheduled appointment. As such, she did not attend and was rescheduled for 05/28/2021.

## 2020-12-02 NOTE — Telephone Encounter (Signed)
Patient daughter advised of follow up appts.

## 2020-12-02 NOTE — Telephone Encounter (Signed)
Pt's daughter called in stating she cannot seem to get the patient to understand why she needs to have neuropsych testing and would like for someone to call her mother and explain it to her.

## 2020-12-05 ENCOUNTER — Ambulatory Visit: Payer: Medicare PPO | Admitting: Internal Medicine

## 2020-12-05 ENCOUNTER — Ambulatory Visit (INDEPENDENT_AMBULATORY_CARE_PROVIDER_SITE_OTHER): Payer: Medicare PPO

## 2020-12-05 ENCOUNTER — Other Ambulatory Visit: Payer: Self-pay

## 2020-12-05 DIAGNOSIS — Z23 Encounter for immunization: Secondary | ICD-10-CM | POA: Diagnosis not present

## 2020-12-05 NOTE — Progress Notes (Signed)
Patient presents today for Flu vaccination, patient received in right  deltoid, patient tolerated well.   

## 2020-12-09 ENCOUNTER — Telehealth: Payer: Medicare PPO

## 2020-12-11 ENCOUNTER — Telehealth: Payer: Self-pay | Admitting: Licensed Clinical Social Worker

## 2020-12-11 ENCOUNTER — Encounter: Payer: Medicare PPO | Admitting: Psychology

## 2020-12-11 NOTE — Telephone Encounter (Signed)
    Clinical Social Work  Care Management   Phone Outreach    12/11/2020 Name: Brayli Klingbeil MRN: [id number] DOB: Dec 06, 1945  Erin Patrick is a 75 y.o. year old female who is a primary care patient of [name], [name], MD .   Reason for referral: Mental Health Counseling and Resources and Caregiver Stress.    F/U phone call today to assess needs, progress and barriers with care plan goals.   Telephone outreach was unsuccessful. A HIPPA compliant phone message was left for the patient providing contact information and requesting a return call.   Plan:CCM LCSW will wait for return call. If no return call is received, Will reach out to patient again in the next 30 days .   Review of patient status, including review of consultants reports, relevant laboratory and other test results, and collaboration with appropriate care team members and the patient's provider was performed as part of comprehensive patient evaluation and provision of care management services.    [name], MSW, LCSW [facility] Care Management [place]  [facility] [email or web address] Phone [phone] 8:18 AM

## 2020-12-16 DIAGNOSIS — M47812 Spondylosis without myelopathy or radiculopathy, cervical region: Secondary | ICD-10-CM

## 2020-12-16 DIAGNOSIS — F32A Depression, unspecified: Secondary | ICD-10-CM

## 2020-12-23 ENCOUNTER — Ambulatory Visit: Payer: Medicare PPO | Admitting: Physician Assistant

## 2021-01-01 ENCOUNTER — Ambulatory Visit: Payer: Self-pay | Admitting: Psychiatry

## 2021-01-06 ENCOUNTER — Ambulatory Visit (INDEPENDENT_AMBULATORY_CARE_PROVIDER_SITE_OTHER): Payer: Medicare PPO | Admitting: Licensed Clinical Social Worker

## 2021-01-06 DIAGNOSIS — M47812 Spondylosis without myelopathy or radiculopathy, cervical region: Secondary | ICD-10-CM

## 2021-01-06 DIAGNOSIS — F4310 Post-traumatic stress disorder, unspecified: Secondary | ICD-10-CM

## 2021-01-06 DIAGNOSIS — F32A Depression, unspecified: Secondary | ICD-10-CM
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
## 2021-01-07 NOTE — Patient Instructions (Addendum)
Visit Information  Thank you for taking time to visit with me today. Please don't hesitate to contact me if I can be of assistance to you before our next scheduled telephone appointment.  Following are the goals we discussed today:  Patient Goals/Self-Care Activities: Over the next 120 days Attend all scheduled appointments with providers Contact clinic with any additional questions or concerns Continue compliance with medications Call [facility] to schedule your appointment for med management  Our next appointment is by telephone on 03/24/21 at 2:30 PM  Please call the care guide team at [phone] if you need to cancel or reschedule your appointment.   Please call 911 if you are experiencing a Mental Health or Behavioral Health Crisis or need someone to talk to.  Patient verbalizes understanding of instructions provided today and agrees to view in MyChart.   [name], MSW, LCSW [facility] Care Management [place]  [facility] [email or web address] Phone [phone] 9:56 AM

## 2021-01-14 ENCOUNTER — Ambulatory Visit: Payer: Medicare PPO | Admitting: Internal Medicine

## 2021-01-15 DIAGNOSIS — M47812 Spondylosis without myelopathy or radiculopathy, cervical region: Secondary | ICD-10-CM

## 2021-01-15 DIAGNOSIS — F32A Depression, unspecified: Secondary | ICD-10-CM

## 2021-02-04 ENCOUNTER — Ambulatory Visit: Payer: Self-pay | Admitting: *Deleted

## 2021-02-04 NOTE — Telephone Encounter (Signed)
Patient experiencing watery eyes, nose running and has itchy spills declined to see any provider but her PCP. PCP next available is not until 02/12/2021      Chief Complaint: worsening watery eyes, itchy spells wants appt  Symptoms: constant watery eyes, runny nose, itching eyes, for years now  swollen eyelids, ears starting to ache Frequency: worsening sx x 1 day  Pertinent Negatives: Patient denies difficulty breathing, fever, no visual issues   Disposition: [] ED /[] Urgent Care (no appt availability in office) / [x] Appointment(In office/virtual)/ []  [facility]/ [] Home Care/ [] Refused Recommended Disposition  Additional Notes:  C/o sx for a couple of years and now worsening sx. Earliest appt scheduled for 02/12/21. Please contact patient if earlier appt available .        Reason for Disposition  Eye allergy is a chronic symptom (recurrent or ongoing AND present > 4 weeks)  Answer Assessment - Initial Assessment Questions 1. SEVERITY: "How bad is the itching?"  (e.g., Scale 1-10; mild, moderate or severe)     Gets itching spells at times  2. ONSET: "When did the eye symptoms start?" (e.g., hours or days ago)     Has had symptoms  for years but worse x 1 day  3. EYELIDS: "Are the eyelids swollen?" If Yes, ask: "How much?"     Yes  "not bad" 4. EYE DISCHARGE: "Is there any discharge from the eye, or eyelid crusting?" If Yes, ask: "How much?"     water 5. TRIGGER: "What do you think triggered the allergic reaction?" (e.g., dust, smoke, pollen, new eye make-up)     Not sure  6. RECURRENT PROBLEM: "Have you experienced eye allergies before?" If Yes, ask: "When was the last time?" and "What medicine worked best in the past?"     Yes  7. CONTACTS: "Do you wear contacts?"     na 8. OTHER SYMPTOMS: "Do you have any other symptoms?" (e.g., runny nose)     Runny nose , earache, watery eyes 9. PREGNANCY: "Is there any chance you are pregnant?" "When was your last menstrual  period?"     na  Protocols used: Eye - Allergy-A-AH

## 2021-02-04 NOTE — Telephone Encounter (Signed)
Lmom to let pt know that Dr. Charlotta Newton is out for the rest of the week.

## 2021-02-12 ENCOUNTER — Ambulatory Visit: Payer: Medicare PPO | Admitting: Internal Medicine

## 2021-02-12 ENCOUNTER — Encounter: Payer: Self-pay | Admitting: Internal Medicine

## 2021-02-12 ENCOUNTER — Other Ambulatory Visit: Payer: Self-pay

## 2021-02-12 VITALS — Temp 97.7°F | Ht 62.99 in | Wt 192.0 lb

## 2021-02-12 DIAGNOSIS — J309 Allergic rhinitis, unspecified: Secondary | ICD-10-CM | POA: Insufficient documentation

## 2021-02-12 DIAGNOSIS — J3489 Other specified disorders of nose and nasal sinuses: Secondary | ICD-10-CM | POA: Diagnosis not present

## 2021-02-12 MED ORDER — FEXOFENADINE HCL 180 MG PO TABS: 180.0000 mg | ORAL_TABLET | Freq: Every day | ORAL | 1 refills | Status: DC

## 2021-02-12 MED ORDER — FLUTICASONE PROPIONATE 50 MCG/ACT NA SUSP: 2.0000 | Freq: Every day | NASAL | 6 refills | Status: DC

## 2021-02-12 MED ORDER — IBUPROFEN 600 MG PO TABS: 600.0000 mg | ORAL_TABLET | Freq: Three times a day (TID) | ORAL | 0 refills | Status: DC | PRN

## 2021-02-12 NOTE — Progress Notes (Signed)
Ht 5' 2.99" (1.6 m)    Wt 192 lb (87.1 kg)    LMP  (LMP Unknown)    BMI 34.02 kg/m    Subjective:    Patient ID: Erin Patrick, female    DOB: January 31, 1946, 75 y.o.   MRN: [id number]  Chief Complaint  Patient presents with   Nasal Congestion    Patient states that her eyes are watery and believes she has allergies.   sinus congestion   Medication refills    Daughter states that she has not had her medication refilled    HPI: Erin Patrick is a 75 y.o. female  Sinus Problem This is a new problem. The current episode started in the past 7 days. There has been no fever. Her pain is at a severity of 4/10. Associated symptoms include congestion, sinus pressure and a sore throat. Pertinent negatives include no coughing, diaphoresis, ear pain, headaches, hoarse voice, neck pain, shortness of breath or sneezing. (No purulent d/c has watery discharge)   Chief Complaint  Patient presents with   Nasal Congestion    Patient states that her eyes are watery and believes she has allergies.   sinus congestion   Medication refills    Daughter states that she has not had her medication refilled    Relevant past medical, surgical, family and social history reviewed and updated as indicated. Interim medical history since our last visit reviewed. Allergies and medications reviewed and updated.  Review of Systems  Constitutional:  Negative for diaphoresis.  HENT:  Positive for congestion, sinus pressure and sore throat. Negative for ear pain, hoarse voice and sneezing.   Respiratory:  Negative for cough and shortness of breath.   Musculoskeletal:  Negative for neck pain.  Neurological:  Negative for headaches.   Per HPI unless specifically indicated above     Objective:    Ht 5' 2.99" (1.6 m)    Wt 192 lb (87.1 kg)    LMP  (LMP Unknown)    BMI 34.02 kg/m   Wt Readings from Last 3 Encounters:  02/12/21 192 lb (87.1 kg)  11/21/20 177 lb (80.3 kg)  11/04/20 177 lb 12.8 oz (80.6 kg)     Physical Exam Vitals and nursing note reviewed.  Constitutional:      General: She is not in acute distress.    Appearance: Normal appearance. She is not ill-appearing or diaphoretic.  HENT:     Nose: Congestion and rhinorrhea present.  Eyes:     Conjunctiva/sclera: Conjunctivae normal.  Cardiovascular:     Rate and Rhythm: Normal rate and regular rhythm.     Heart sounds: No murmur heard. Pulmonary:     Effort: No respiratory distress.     Breath sounds: No stridor. No wheezing or rhonchi.  Skin:    General: Skin is warm and dry.     Coloration: Skin is not jaundiced.     Findings: No erythema.  Neurological:     Mental Status: She is alert.   Results for orders placed or performed in visit on 11/28/20  Lipid panel  Result Value Ref Range   Cholesterol, Total 209 (H) 100 - 199 mg/dL   Triglycerides 131 0 - 149 mg/dL   HDL 64 >39 mg/dL   VLDL Cholesterol Cal 23 5 - 40 mg/dL   LDL Chol Calc (NIH) 122 (H) 0 - 99 mg/dL   Chol/HDL Ratio 3.3 0.0 - 4.4 ratio  CBC with Differential/Platelet  Result Value Ref Range   WBC  4.3 3.4 - 10.8 x10E3/uL   RBC 5.16 3.77 - 5.28 x10E6/uL   Hemoglobin 14.3 11.1 - 15.9 g/dL   Hematocrit 44.4 34.0 - 46.6 %   MCV 86 79 - 97 fL   MCH 27.7 26.6 - 33.0 pg   MCHC 32.2 31.5 - 35.7 g/dL   RDW 13.9 11.7 - 15.4 %   Platelets 283 150 - 450 x10E3/uL   Neutrophils 34 Not Estab. %   Lymphs 55 Not Estab. %   Monocytes 8 Not Estab. %   Eos 2 Not Estab. %   Basos 1 Not Estab. %   Neutrophils Absolute 1.5 1.4 - 7.0 x10E3/uL   Lymphocytes Absolute 2.4 0.7 - 3.1 x10E3/uL   Monocytes Absolute 0.4 0.1 - 0.9 x10E3/uL   EOS (ABSOLUTE) 0.1 0.0 - 0.4 x10E3/uL   Basophils Absolute 0.0 0.0 - 0.2 x10E3/uL   Immature Granulocytes 0 Not Estab. %   Immature Grans (Abs) 0.0 0.0 - 0.1 x10E3/uL  Thyroid Panel With TSH  Result Value Ref Range   TSH 2.120 0.450 - 4.500 uIU/mL   T4, Total 8.8 4.5 - 12.0 ug/dL   T3 Uptake Ratio 24 24 - 39 %   Free Thyroxine Index  2.1 1.2 - 4.9  Bayer DCA Hb A1c Waived  Result Value Ref Range   HB A1C (BAYER DCA - WAIVED) 5.6 4.8 - 5.6 %  Comprehensive metabolic panel  Result Value Ref Range   Glucose 92 70 - 99 mg/dL   BUN 10 8 - 27 mg/dL   Creatinine, Ser 0.94 0.57 - 1.00 mg/dL   eGFR 62 >59 mL/min/1.73   BUN/Creatinine Ratio 11 (L) 12 - 28   Sodium 144 134 - 144 mmol/L   Potassium 5.2 3.5 - 5.2 mmol/L   Chloride 103 96 - 106 mmol/L   CO2 22 20 - 29 mmol/L   Calcium 9.6 8.7 - 10.3 mg/dL   Total Protein 6.9 6.0 - 8.5 g/dL   Albumin 4.4 3.7 - 4.7 g/dL   Globulin, Total 2.5 1.5 - 4.5 g/dL   Albumin/Globulin Ratio 1.8 1.2 - 2.2   Bilirubin Total 0.2 0.0 - 1.2 mg/dL   Alkaline Phosphatase 107 44 - 121 IU/L   AST 29 0 - 40 IU/L   ALT 17 0 - 32 IU/L        Current Outpatient Medications:    atorvastatin (LIPITOR) 10 MG tablet, Take 1 tablet (10 mg total) by mouth daily. (Patient not taking: Reported on 02/12/2021), Disp: 30 tablet, Rfl: 3   citalopram (CELEXA) 20 MG tablet, Take 1 tablet (20 mg total) by mouth in the morning. (Patient not taking: Reported on 02/12/2021), Disp: 30 tablet, Rfl: 5   diclofenac Sodium (VOLTAREN) 1 % GEL, Apply 4 g topically 4 (four) times daily. (Patient not taking: Reported on 02/12/2021), Disp: 100 g, Rfl: 12   fexofenadine (ALLEGRA ALLERGY) 180 MG tablet, Take 1 tablet (180 mg total) by mouth daily. (Patient not taking: Reported on 02/12/2021), Disp: 10 tablet, Rfl: 1   nortriptyline (PAMELOR) 10 MG capsule, Take 1 capsule (10 mg total) by mouth at bedtime. (Patient not taking: Reported on 02/12/2021), Disp: 30 capsule, Rfl: 2    Assessment & Plan:  Allergic rhinitis will start pt on allegra / flonase  Chronic new. Didn't keep any appointments  Problem List Items Addressed This Visit   None    No orders of the defined types were placed in this encounter.    No orders of the defined  types were placed in this encounter.    Follow up plan: No follow-ups on  file.

## 2021-02-24 DIAGNOSIS — M1990 Unspecified osteoarthritis, unspecified site: Secondary | ICD-10-CM | POA: Diagnosis not present

## 2021-02-24 DIAGNOSIS — Z6833 Body mass index (BMI) 33.0-33.9, adult: Secondary | ICD-10-CM | POA: Diagnosis not present

## 2021-02-24 DIAGNOSIS — E119 Type 2 diabetes mellitus without complications: Secondary | ICD-10-CM | POA: Diagnosis not present

## 2021-02-24 DIAGNOSIS — F324 Major depressive disorder, single episode, in partial remission: Secondary | ICD-10-CM | POA: Diagnosis not present

## 2021-02-24 DIAGNOSIS — R030 Elevated blood-pressure reading, without diagnosis of hypertension: Secondary | ICD-10-CM | POA: Diagnosis not present

## 2021-02-24 DIAGNOSIS — F0394 Unspecified dementia, unspecified severity, with anxiety: Secondary | ICD-10-CM | POA: Diagnosis not present

## 2021-02-24 DIAGNOSIS — J301 Allergic rhinitis due to pollen: Secondary | ICD-10-CM | POA: Diagnosis not present

## 2021-02-24 DIAGNOSIS — E669 Obesity, unspecified: Secondary | ICD-10-CM | POA: Diagnosis not present

## 2021-02-24 DIAGNOSIS — E785 Hyperlipidemia, unspecified: Secondary | ICD-10-CM | POA: Diagnosis not present

## 2021-02-27 ENCOUNTER — Other Ambulatory Visit: Payer: Self-pay | Admitting: Family Medicine

## 2021-02-27 NOTE — Telephone Encounter (Signed)
Requested Prescriptions  Pending Prescriptions Disp Refills   nortriptyline (PAMELOR) 10 MG capsule [Pharmacy Med Name: NORTRIPTYLINE HCL 10 MG CAP] 30 capsule 2    Sig: TAKE 1 CAPSULE BY MOUTH AT BEDTIME     Psychiatry:  Antidepressants - Heterocyclics (TCAs) Passed - 02/27/2021 12:40 PM      Passed - Completed PHQ-2 or PHQ-9 in the last 360 days      Passed - Valid encounter within last 6 months    Recent Outpatient Visits          2 weeks ago Sinus pressure   [place] [name], [name], MD   3 months ago Ear pain, bilateral   [facility] [name], [name], MD   3 months ago Left arm pain   [facility] [name], [name], MD   4 months ago Left arm pain   [place], Megan P, DO   6 months ago Memory loss   [place], [place]      Future Appointments            In 5 months [name], [name], MD [facility]

## 2021-03-13 ENCOUNTER — Telehealth: Payer: Self-pay | Admitting: Internal Medicine

## 2021-03-13 NOTE — Telephone Encounter (Signed)
Copied from CRM [id number]. Topic: General - Inquiry >> Mar 13, 2021  2:13 PM Daphine Deutscher D wrote: Reason for CRM: Pt 's daughter called asking to speak to Dr. Charlotta Newton or her nurse about her mother.  Daughter would not disclose the reason why  CB#  [phone]

## 2021-03-13 NOTE — Telephone Encounter (Signed)
Called and spoke with Patients daughter [name]. Daughter states that they do not care for the Mental health provider they recently seen within [facility]. Daughter would like to receive a new referral to a different Mental Health provider outside of [place], somewhere within the [place], [place], or [place] [facility] health system.

## 2021-03-19 DIAGNOSIS — E113393 Type 2 diabetes mellitus with moderate nonproliferative diabetic retinopathy without macular edema, bilateral: Secondary | ICD-10-CM | POA: Diagnosis not present

## 2021-03-24 ENCOUNTER — Ambulatory Visit (INDEPENDENT_AMBULATORY_CARE_PROVIDER_SITE_OTHER): Payer: Medicare PPO | Admitting: Licensed Clinical Social Worker

## 2021-03-24 DIAGNOSIS — F32A Depression, unspecified: Secondary | ICD-10-CM

## 2021-03-24 DIAGNOSIS — M47812 Spondylosis without myelopathy or radiculopathy, cervical region: Secondary | ICD-10-CM

## 2021-03-24 DIAGNOSIS — F4310 Post-traumatic stress disorder, unspecified: Secondary | ICD-10-CM

## 2021-03-25 NOTE — Patient Instructions (Signed)
Visit Information  Thank you for taking time to visit with me today. Please don't hesitate to contact me if I can be of assistance to you before our next scheduled telephone appointment.  Following are the goals we discussed today:  Patient Goals/Self-Care Activities: Over the next 120 days Attend all scheduled appointments with providers Contact clinic with any additional questions or concerns Continue compliance with medications Call [facility] to schedule your appointment for med management  Our next appointment is by telephone on 06/11/21 at 3:00 PM  Please call the care guide team at [phone] if you need to cancel or reschedule your appointment.   If you are experiencing a Mental Health or Behavioral Health Crisis or need someone to talk to, please call the [place] National Suicide Prevention Lifeline: 905 229 1821 or TTY: (279) 682-0213 TTY (385)420-5588) to talk to a trained counselor call 911   Patient verbalizes understanding of instructions and care plan provided today and agrees to view in MyChart. Active MyChart status confirmed with patient.    [name], MSW, LCSW [facility] [place]   [facility] [email or web address] Phone [phone] 5:26 AM
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
## 2021-04-15 DIAGNOSIS — F32A Depression, unspecified: Secondary | ICD-10-CM

## 2021-04-15 DIAGNOSIS — M47812 Spondylosis without myelopathy or radiculopathy, cervical region: Secondary | ICD-10-CM

## 2021-04-16 ENCOUNTER — Ambulatory Visit: Payer: Medicare PPO | Admitting: Internal Medicine

## 2021-04-17 DIAGNOSIS — R7301 Impaired fasting glucose: Secondary | ICD-10-CM | POA: Diagnosis not present

## 2021-04-17 DIAGNOSIS — M549 Dorsalgia, unspecified: Secondary | ICD-10-CM | POA: Diagnosis not present

## 2021-04-17 DIAGNOSIS — G301 Alzheimer's disease with late onset: Secondary | ICD-10-CM | POA: Diagnosis not present

## 2021-04-17 DIAGNOSIS — M170 Bilateral primary osteoarthritis of knee: Secondary | ICD-10-CM | POA: Diagnosis not present

## 2021-04-17 DIAGNOSIS — G8929 Other chronic pain: Secondary | ICD-10-CM | POA: Diagnosis not present

## 2021-04-17 DIAGNOSIS — F02818 Dementia in other diseases classified elsewhere, unspecified severity, with other behavioral disturbance: Secondary | ICD-10-CM | POA: Diagnosis not present

## 2021-04-17 DIAGNOSIS — M545 Low back pain, unspecified: Secondary | ICD-10-CM | POA: Diagnosis not present

## 2021-04-17 DIAGNOSIS — M79602 Pain in left arm: Secondary | ICD-10-CM | POA: Diagnosis not present

## 2021-04-21 ENCOUNTER — Other Ambulatory Visit: Payer: Self-pay | Admitting: Internal Medicine

## 2021-04-22 NOTE — Telephone Encounter (Signed)
Requested Prescriptions  ?Pending Prescriptions Disp Refills  ?? atorvastatin (LIPITOR) 10 MG tablet [Pharmacy Med Name: ATORVASTATIN CALCIUM 10 MG TAB] 30 tablet 3  ?  Sig: TAKE 1 TABLET BY MOUTH ONCE EVERY EVENING  ?  ? Cardiovascular:  Antilipid - Statins Failed - 04/21/2021 10:47 AM  ?  ?  Failed - Lipid Panel in normal range within the last 12 months  ?  Cholesterol, Total  ?Date Value Ref Range Status  ?11/28/2020 209 (H) 100 - 199 mg/dL Final  ? ?LDL Chol Calc (NIH)  ?Date Value Ref Range Status  ?11/28/2020 122 (H) 0 - 99 mg/dL Final  ? ?HDL  ?Date Value Ref Range Status  ?11/28/2020 64 >39 mg/dL Final  ? ?Triglycerides  ?Date Value Ref Range Status  ?11/28/2020 131 0 - 149 mg/dL Final  ? ?  ?  ?  Passed - Patient is not pregnant  ?  ?  Passed - Valid encounter within last 12 months  ?  Recent Outpatient Visits   ?      ? 2 months ago Sinus pressure  ? [facility] [name], [name], MD  ? 5 months ago Ear pain, bilateral  ? [facility] [name], [name], MD  ? 5 months ago Left arm pain  ? [facility] [name], [name], MD  ? 6 months ago Left arm pain  ? [facility] [place], Megan P, DO  ? 7 months ago Memory loss  ? [facility] [name], [name], MD  ?  ?  ?Future Appointments   ?        ? In 3 months [name], [name], MD [facility]  ?  ? ?  ?  ?  ?? nortriptyline (PAMELOR) 10 MG capsule [Pharmacy Med Name: NORTRIPTYLINE HCL 10 MG CAP] 30 capsule 2  ?  Sig: TAKE 1 CAPSULE BY MOUTH AT BEDTIME  ?  ? Psychiatry:  Antidepressants - Heterocyclics (TCAs) Passed - 04/21/2021 10:47 AM  ?  ?  Passed - Completed PHQ-2 or PHQ-9 in the last 360 days  ?  ?  Passed - Valid encounter within last 6 months  ?  Recent Outpatient Visits   ?      ? 2 months ago Sinus pressure  ? [facility] [name], [name], MD  ? 5 months ago Ear pain, bilateral  ? [facility] [name], [name], MD  ? 5 months ago Left arm pain  ? [facility] [name], [name],  MD  ? 6 months ago Left arm pain  ? [facility] [place], Megan P, DO  ? 7 months ago Memory loss  ? [facility] [name], [name], MD  ?  ?  ?Future Appointments   ?        ? In 3 months [name], [name], MD [facility]  ?  ? ?  ?  ?  ? ? ?

## 2021-05-28 ENCOUNTER — Encounter: Payer: Self-pay | Admitting: Psychology

## 2021-05-28 ENCOUNTER — Ambulatory Visit (INDEPENDENT_AMBULATORY_CARE_PROVIDER_SITE_OTHER): Payer: Medicare PPO | Admitting: Psychology

## 2021-05-28 ENCOUNTER — Ambulatory Visit: Payer: Medicare PPO | Admitting: Psychology

## 2021-05-28 DIAGNOSIS — R4189 Other symptoms and signs involving cognitive functions and awareness: Secondary | ICD-10-CM

## 2021-05-28 DIAGNOSIS — G5601 Carpal tunnel syndrome, right upper limb: Secondary | ICD-10-CM | POA: Insufficient documentation

## 2021-05-28 DIAGNOSIS — F330 Major depressive disorder, recurrent, mild: Secondary | ICD-10-CM | POA: Diagnosis not present

## 2021-05-28 DIAGNOSIS — F0280 Dementia in other diseases classified elsewhere without behavioral disturbance: Secondary | ICD-10-CM | POA: Diagnosis not present

## 2021-05-28 DIAGNOSIS — E119 Type 2 diabetes mellitus without complications: Secondary | ICD-10-CM | POA: Insufficient documentation

## 2021-05-28 DIAGNOSIS — G309 Alzheimer's disease, unspecified: Secondary | ICD-10-CM

## 2021-05-28 DIAGNOSIS — E1142 Type 2 diabetes mellitus with diabetic polyneuropathy: Secondary | ICD-10-CM | POA: Insufficient documentation

## 2021-05-28 HISTORY — DX: Dementia in other diseases classified elsewhere, unspecified severity, without behavioral disturbance, psychotic disturbance, mood disturbance, and anxiety: F02.80

## 2021-05-28 NOTE — Progress Notes (Signed)
? ?  Psychometrician Note ?  ?Cognitive testing was administered to [facility] by Wallace Keller, B.S. (psychometrist) under the supervision of Dr. Newman Nickels, Ph.D., licensed psychologist on 05/28/2021. [name] did not appear overtly distressed by the testing session per behavioral observation or responses across self-report questionnaires. Rest breaks were offered.  ?  ?The battery of tests administered was selected by Dr. Newman Nickels, Ph.D. with consideration to Ms. Heater's current level of functioning, the nature of her symptoms, emotional and behavioral responses during interview, level of literacy, observed level of motivation/effort, and the nature of the referral question. This battery was communicated to the psychometrist. Communication between Dr. Newman Nickels, Ph.D. and the psychometrist was ongoing throughout the evaluation and Dr. Newman Nickels, Ph.D. was immediately accessible at all times. Dr. Newman Nickels, Ph.D. provided supervision to the psychometrist on the date of this service to the extent necessary to assure the quality of all services provided.  ?  ?Erin Patrick will return within approximately 1-2 weeks for an interactive feedback session with Dr. Milbert Coulter at which time her test performances, clinical impressions, and treatment recommendations will be reviewed in detail. [name] understands she can contact our office should she require our assistance before this time. ? ?A total of 145 minutes of billable time were spent face-to-face with [name] by the psychometrist. This includes both test administration and scoring time. Billing for these services is reflected in the clinical report generated by Dr. Newman Nickels, Ph.D. ? ?This note reflects time spent with the psychometrician and does not include test scores or any clinical interpretations made by Dr. Milbert Coulter. The full report will follow in a separate note.  ?
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
## 2021-05-29 ENCOUNTER — Encounter: Payer: Self-pay | Admitting: Psychology

## 2021-06-04 ENCOUNTER — Ambulatory Visit (INDEPENDENT_AMBULATORY_CARE_PROVIDER_SITE_OTHER): Payer: Medicare PPO | Admitting: Psychology

## 2021-06-04 DIAGNOSIS — F0280 Dementia in other diseases classified elsewhere without behavioral disturbance: Secondary | ICD-10-CM

## 2021-06-04 DIAGNOSIS — G309 Alzheimer's disease, unspecified: Secondary | ICD-10-CM

## 2021-06-04 NOTE — Progress Notes (Signed)
? ?  Neuropsychology Feedback Session ?[place] ?[place] Department of Neurology ? ?Reason for Referral:  ? ?Erin Patrick is a 75 y.o. right-handed African-American female referred by  Marlowe Kays, PA-C , to characterize her current cognitive functioning and assist with diagnostic clarity and treatment planning in the context of subjective cognitive decline and concern for a neurodegenerative illness.  ? ?Feedback:  ? ?Erin Patrick completed a comprehensive neuropsychological evaluation on 05/28/2021. Please refer to that encounter for the full report and recommendations. Briefly, results suggested severe impairment across all aspects of learning and memory. Additional impairment was exhibited across confrontation naming, while executive functioning was below expectation. Performance variability was exhibited across processing speed, complex attention, semantic fluency, and visuospatial abilities. The most likely etiology for her current dementia presentation is Alzheimer's disease. Recent neuroimaging revealed prominent bilateral mesial temporal lobe volume loss, which is a well established risk factor for this illness. Across memory testing, Erin Patrick did not benefit from repeated exposure to information, was fully amnestic (i.e., 0% retention) across all memory tasks after a brief delay, and performed poorly across yes/no recognition trials. This suggests both rapid forgetting and a severe memory storage impairment, both of which are hallmark findings in this illness. Additional impairment in confrontation naming and variability across semantic fluency and visuospatial abilities would represent typical disease progression. ? ?Erin Patrick was accompanied by her daughter during the current feedback session. Content of the current session focused on the results of her neuropsychological evaluation. Erin Patrick was given the opportunity to ask questions and her questions were answered. She was  encouraged to reach out should additional questions arise. A copy of her report was provided at the conclusion of the visit.  ? ?  ? ?20 minutes were spent conducting the current feedback session with Erin Patrick, billed as one unit [phone].  ?
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
## 2021-06-09 ENCOUNTER — Ambulatory Visit: Payer: Medicare PPO | Admitting: Physician Assistant

## 2021-06-09 ENCOUNTER — Encounter: Payer: Self-pay | Admitting: Physician Assistant

## 2021-06-09 VITALS — BP 126/77 | HR 86 | Resp 18 | Ht 63.0 in | Wt 189.0 lb

## 2021-06-09 DIAGNOSIS — G309 Alzheimer's disease, unspecified: Secondary | ICD-10-CM | POA: Diagnosis not present

## 2021-06-09 DIAGNOSIS — F0280 Dementia in other diseases classified elsewhere without behavioral disturbance: Secondary | ICD-10-CM

## 2021-06-09 MED ORDER — DONEPEZIL HCL 10 MG PO TABS: ORAL_TABLET | ORAL | 11 refills | Status: DC

## 2021-06-09 NOTE — Patient Instructions (Addendum)
It was a pleasure to see you today at our office.  ? ?Recommendations: ? ?Follow up in 6 months ?We will start donepezil half tablet (5mg ) daily for 2  weeks.  If you are tolerating the medication, then after 2 weeks, we will increase the dose to a full tablet of 10 mg daily.  Side effects include nausea, vomiting, diarrhea, vivid dreams, and muscle cramps.  Please call the clinic if you experience any of these symptoms.  ?Follow with Psychiatry  ?Feel free to visit Facebook page " [employer]" for tips of how to care for people with memory problems.  ? ? ?RECOMMENDATIONS FOR ALL PATIENTS WITH MEMORY PROBLEMS: ?1. Continue to exercise (Recommend 30 minutes of walking everyday, or 3 hours every week) ?2. Increase social interactions - continue going to [place] and enjoy social gatherings with friends and family ?3. Eat healthy, avoid fried foods and eat more fruits and vegetables ?4. Maintain adequate blood pressure, blood sugar, and blood cholesterol level. Reducing the risk of stroke and cardiovascular disease also helps promoting better memory. ?5. Avoid stressful situations. Live a simple life and avoid aggravations. Organize your time and prepare for the next day in anticipation. ?6. Sleep well, avoid any interruptions of sleep and avoid any distractions in the bedroom that may interfere with adequate sleep quality ?7. Avoid sugar, avoid sweets as there is a strong link between excessive sugar intake, diabetes, and cognitive impairment ?We discussed the Mediterranean diet, which has been shown to help patients reduce the risk of progressive memory disorders and reduces cardiovascular risk. This includes eating fish, eat fruits and green leafy vegetables, nuts like almonds and hazelnuts, walnuts, and also use olive oil. Avoid fast foods and fried foods as much as possible. Avoid sweets and sugar as sugar use has been linked to worsening of memory function. ? ?There is always a concern of gradual progression of memory  problems. If this is the case, then we may need to adjust level of care according to patient needs. Support, both to the patient and caregiver, should then be put into place.  ? ? ? ? ?You have been referred for a neuropsychological evaluation (i.e., evaluation of memory and thinking abilities). Please bring someone with you to this appointment if possible, as it is helpful for the doctor to hear from both you and another adult who knows you well. Please bring eyeglasses and hearing aids if you wear them.  ?  ?The evaluation will take approximately 3 hours and has two parts: ?  ?The first part is a clinical interview with the neuropsychologist (Dr. Caspe or Dr. Milbert Coulter). During the interview, the neuropsychologist will speak with you and the individual you brought to the appointment.  ?  ?The second part of the evaluation is testing with the doctor's technician Roseanne Reno or Annabelle Harman). During the testing, the technician will ask you to remember different types of material, solve problems, and answer some questionnaires. Your family member will not be present for this portion of the evaluation. ?  ?Please note: We must reserve several hours of the neuropsychologist's time and the psychometrician's time for your evaluation appointment. As such, there is a No-Show fee of $100. If you are unable to attend any of your appointments, please contact our office as soon as possible to reschedule.  ? ? ?FALL PRECAUTIONS: Be cautious when walking. Scan the area for obstacles that may increase the risk of trips and falls. When getting up in the mornings, sit up at the edge  of the bed for a few minutes before getting out of bed. Consider elevating the bed at the head end to avoid drop of blood pressure when getting up. Walk always in a well-lit room (use night lights in the walls). Avoid area rugs or power cords from appliances in the middle of the walkways. Use a walker or a cane if necessary and consider physical therapy for balance  exercise. Get your eyesight checked regularly. ? ?FINANCIAL OVERSIGHT: Supervision, especially oversight when making financial decisions or transactions is also recommended. ? ?HOME SAFETY: Consider the safety of the kitchen when operating appliances like stoves, microwave oven, and blender. Consider having supervision and share cooking responsibilities until no longer able to participate in those. Accidents with firearms and other hazards in the house should be identified and addressed as well. ? ? ?ABILITY TO BE LEFT ALONE: If patient is unable to contact 911 operator, consider using [facility], or when the need is there, arrange for someone to stay with patients. Smoking is a fire hazard, consider supervision or cessation. Risk of wandering should be assessed by caregiver and if detected at any point, supervision and safe proof recommendations should be instituted. ? ?MEDICATION SUPERVISION: Inability to self-administer medication needs to be constantly addressed. Implement a mechanism to ensure safe administration of the medications. ? ? ?DRIVING: Regarding driving, in patients with progressive memory problems, driving will be impaired. We advise to have someone else do the driving if trouble finding directions or if minor accidents are reported. Independent driving assessment is available to determine safety of driving. ? ? ?If you are interested in the driving assessment, you can contact the following: ? ?[employer] in [place] [phone] ? ?[facility] [phone] ? ?[facility] [phone] ? ?[facility] [phone] or [phone] ? ? ? ?Mediterranean Diet ?A Mediterranean diet refers to food and lifestyle choices that are based on the traditions of countries located on the [place]. This way of eating has been shown to help prevent certain conditions and improve outcomes for people who have chronic diseases, like kidney disease and heart  disease. ?What are tips for following this plan? ?Lifestyle  ?Cook and eat meals together with your family, when possible. ?Drink enough fluid to keep your urine clear or pale yellow. ?Be physically active every day. This includes: ?Aerobic exercise like running or swimming. ?Leisure activities like gardening, walking, or housework. ?Get 7-8 hours of sleep each night. ?If recommended by your health care provider, drink red wine in moderation. This means 1 glass a day for nonpregnant women and 2 glasses a day for men. A glass of wine equals 5 oz (150 mL). ?Reading food labels  ?Check the serving size of packaged foods. For foods such as rice and pasta, the serving size refers to the amount of cooked product, not dry. ?Check the total fat in packaged foods. Avoid foods that have saturated fat or trans fats. ?Check the ingredients list for added sugars, such as corn syrup. ?Shopping  ?At the grocery store, buy most of your food from the areas near the walls of the store. This includes: ?Fresh fruits and vegetables (produce). ?Grains, beans, nuts, and seeds. Some of these may be available in unpackaged forms or large amounts (in bulk). ?Fresh seafood. ?Poultry and eggs. ?Low-fat dairy products. ?Buy whole ingredients instead of prepackaged foods. ?Buy fresh fruits and vegetables in-season from local farmers markets. ?Buy frozen fruits and vegetables in resealable bags. ?If you do not have access to quality  fresh seafood, buy precooked frozen shrimp or canned fish, such as tuna, salmon, or sardines. ?Buy small amounts of raw or cooked vegetables, salads, or olives from the deli or salad bar at your store. ?Stock your pantry so you always have certain foods on hand, such as olive oil, canned tuna, canned tomatoes, rice, pasta, and beans. ?Cooking  ?Cook foods with extra-virgin olive oil instead of using butter or other vegetable oils. ?Have meat as a side dish, and have vegetables or grains as your main dish. This means  having meat in small portions or adding small amounts of meat to foods like pasta or stew. ?Use beans or vegetables instead of meat in common dishes like chili or lasagna. ?Experiment with different cooking methods.

## 2021-06-11 ENCOUNTER — Telehealth: Payer: Medicare PPO

## 2021-06-11 ENCOUNTER — Telehealth: Payer: Self-pay | Admitting: Licensed Clinical Social Worker

## 2021-06-11 NOTE — Telephone Encounter (Signed)
? ?   Clinical Social Work  ?Care Management  ? Phone Outreach  ? ? ?06/11/2021 ?Name: Erin Patrick MRN: [id number] DOB: 03-Jul-1945 ? ?Erin Patrick is a 76 y.o. year old female who is a primary care patient of [name], [name], MD .  ? ?Reason for referral: Caregiver Stress.   ? ?F/U phone call today to assess needs, progress and barriers with care plan goals.   Telephone outreach was unsuccessful. A HIPPA compliant phone message was left for the patient providing contact information and requesting a return call.  ? ?Plan:CCM LCSW will wait for return call. If no return call is received, Will route chart to Care Guide to see if patient would like to reschedule phone appointment  ? ?Review of patient status, including review of consultants reports, relevant laboratory and other test results, and collaboration with appropriate care team members and the patient's provider was performed as part of comprehensive patient evaluation and provision of care management services.   ? ? ?[name], MSW, LCSW ?[facility] Care Management ?[facility] ?[email or web address] ?Phone [phone] ?4:15 PM ? ? ?  ? ? ? ? ?

## 2021-06-16 ENCOUNTER — Telehealth: Payer: Self-pay | Admitting: Physician Assistant

## 2021-06-16 NOTE — Telephone Encounter (Signed)
1. Which medications need refilled? (List name and dosage, if known) donepezil- pharmacy never got it from her last visit ? ?2. Which pharmacy/location is medication to be sent to? (include street and city if local pharmacy) [facility] [place] ? ? ?

## 2021-06-16 NOTE — Telephone Encounter (Signed)
Called [facility] and was informed that they have received the Aricept. Called and informed patients daughter [name] that RX has been sent and received at [facility]. Patients daughter verbalized understanding and thanked me for the call.  ?

## 2021-06-19 DIAGNOSIS — Z5321 Procedure and treatment not carried out due to patient leaving prior to being seen by health care provider: Secondary | ICD-10-CM | POA: Diagnosis not present

## 2021-06-19 DIAGNOSIS — R079 Chest pain, unspecified: Secondary | ICD-10-CM | POA: Diagnosis not present

## 2021-06-20 ENCOUNTER — Telehealth: Payer: Self-pay

## 2021-06-20 DIAGNOSIS — R079 Chest pain, unspecified: Secondary | ICD-10-CM | POA: Diagnosis not present

## 2021-06-20 DIAGNOSIS — I4581 Long QT syndrome: Secondary | ICD-10-CM | POA: Diagnosis not present

## 2021-06-20 NOTE — Chronic Care Management (AMB) (Signed)
?  Chronic Care Management ?Note ? ?06/20/2021 ?Name: Erin Patrick MRN: [id number] DOB: 10/15/1945 ? ?Erin Patrick is a 76 y.o. year old female who is a primary care patient of [name], [name], MD and is actively engaged with the care management team. I reached out to Erin Patrick by phone today to assist with re-scheduling a follow up visit with the Licensed Clinical Social Worker ? ?Follow up plan: ?Unsuccessful telephone outreach attempt made. A HIPAA compliant phone message was left for the patient providing contact information and requesting a return call.  ?The care management team will reach out to the patient again over the next 7 days.  ?If patient returns call to provider office, please advise to call [facility]  at [phone] ? ?Erin Patrick, RMA ?Care Guide, Embedded Care Coordination ?  Care Management  ?[place], [place] [address] ?Direct Dial: [phone] ?[email or web address] ?Website: [email or web address]  ? ?

## 2021-06-25 NOTE — Chronic Care Management (AMB) (Signed)
?  Chronic Care Management ?Note ? ?06/25/2021 ?Name: Erin Patrick MRN: [id number] DOB: 27-Mar-1945 ? ?Erin Patrick is a 76 y.o. year old female who is a primary care patient of [name], [name], MD and is actively engaged with the care management team. I reached out to [facility] by phone today to assist with re-scheduling a follow up visit with the Licensed Clinical Social Worker ? ?Follow up plan: ?Telephone appointment with care management team member scheduled for:07/07/2021 ? ?Penne Lash, RMA ?Care Guide, Embedded Care Coordination ?[place]  Care Management  ?[place], [place] [address] ?Direct Dial: [phone] ?[email or web address] ?Website: [email or web address]  ? ?

## 2021-07-07 ENCOUNTER — Ambulatory Visit (INDEPENDENT_AMBULATORY_CARE_PROVIDER_SITE_OTHER): Payer: Medicare PPO | Admitting: Licensed Clinical Social Worker

## 2021-07-07 DIAGNOSIS — F4310 Post-traumatic stress disorder, unspecified: Secondary | ICD-10-CM

## 2021-07-07 DIAGNOSIS — F330 Major depressive disorder, recurrent, mild: Secondary | ICD-10-CM

## 2021-07-07 DIAGNOSIS — F0280 Dementia in other diseases classified elsewhere without behavioral disturbance: Secondary | ICD-10-CM
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
## 2021-07-09 NOTE — Patient Instructions (Signed)
Visit Information  Thank you for taking time to visit with me today. Please don't hesitate to contact me if I can be of assistance to you before our next scheduled telephone appointment.  Following are the goals we discussed today:  Patient Goals/Self-Care Activities: Over the next 120 days Attend all scheduled appointments with providers Contact clinic with any additional questions or concerns Comply with medications, as prescribed  Our next appointment is by telephone on 07/28/21 at 1 PM  Please call the care guide team at [phone] if you need to cancel or reschedule your appointment.   If you are experiencing a Mental Health or Behavioral Health Crisis or need someone to talk to, please call 911   Patient verbalizes understanding of instructions and care plan provided today and agrees to view in MyChart. Active MyChart status and patient understanding of how to access instructions and care plan via MyChart confirmed with patient.     [name], MSW, LCSW [facility] Care Management [place]  [facility] [email or web address] Phone [phone] 7:20 AM

## 2021-07-16 ENCOUNTER — Ambulatory Visit: Payer: Medicare PPO | Admitting: Internal Medicine

## 2021-07-16 ENCOUNTER — Encounter: Payer: Self-pay | Admitting: Internal Medicine

## 2021-07-16 VITALS — BP 121/80 | HR 97 | Temp 98.2°F | Ht 62.99 in | Wt 187.2 lb

## 2021-07-16 DIAGNOSIS — F32A Depression, unspecified: Secondary | ICD-10-CM

## 2021-07-16 DIAGNOSIS — G8929 Other chronic pain: Secondary | ICD-10-CM

## 2021-07-16 DIAGNOSIS — M545 Low back pain, unspecified: Secondary | ICD-10-CM | POA: Diagnosis not present

## 2021-07-16 DIAGNOSIS — M1990 Unspecified osteoarthritis, unspecified site: Secondary | ICD-10-CM

## 2021-07-16 DIAGNOSIS — F0390 Unspecified dementia without behavioral disturbance: Secondary | ICD-10-CM

## 2021-07-16 LAB — MICROSCOPIC EXAMINATION

## 2021-07-16 LAB — URINALYSIS, ROUTINE W REFLEX MICROSCOPIC
Bilirubin, UA: NEGATIVE
Glucose, UA: NEGATIVE
Nitrite, UA: NEGATIVE
Specific Gravity, UA: >1.03 — ABNORMAL HIGH (ref 1.005–1.030)
Urobilinogen, Ur: 0.2 mg/dL (ref 0.2–1.0)
pH, UA: 5.5 (ref 5.0–7.5)

## 2021-07-16 MED ORDER — CITALOPRAM HYDROBROMIDE 20 MG PO TABS: 20.0000 mg | ORAL_TABLET | Freq: Every morning | ORAL | 5 refills | Status: DC

## 2021-07-16 MED ORDER — ATORVASTATIN CALCIUM 10 MG PO TABS: ORAL_TABLET | ORAL | 2 refills | Status: DC

## 2021-07-16 MED ORDER — NORTRIPTYLINE HCL 10 MG PO CAPS: 10.0000 mg | ORAL_CAPSULE | Freq: Every day | ORAL | 2 refills | Status: DC

## 2021-07-16 MED ORDER — FEXOFENADINE HCL 180 MG PO TABS: 180.0000 mg | ORAL_TABLET | Freq: Every day | ORAL | 1 refills | Status: DC

## 2021-07-16 NOTE — Progress Notes (Unsigned)
BP 121/80   Pulse 97   Temp 98.2 F (36.8 C) (Oral)   Ht 5' 2.99" (1.6 m)   Wt 187 lb 3.2 oz (84.9 kg)   LMP  (LMP Unknown)   SpO2 96%   BMI 33.17 kg/m    Subjective:    Patient ID: Erin Patrick, female    DOB: [birth year], 76 y.o.   MRN: [id number]  Chief Complaint  Patient presents with  . Back Pain    Mid lower back pain that radiates down the right leg for 2 months, pain comes and goes. Patient states that she has had a previous surgery on her back in that area.    HPI: Erin Patrick is a 76 y.o. female   pt is a very poor historian, dementia worsneing, had a tiff with her daughter who left the room while nursing staff was rooming pt.  Not much histroy available today.   Back Pain This is a chronic (pain persists, has had this in the past) problem. The problem has been waxing and waning since onset. The pain is present in the lumbar spine. The pain is at a severity of 6/10. Pertinent negatives include no abdominal pain, bladder incontinence, bowel incontinence, numbness, paresis, paresthesias, pelvic pain, perianal numbness, tingling or weakness.   Chief Complaint  Patient presents with  . Back Pain    Mid lower back pain that radiates down the right leg for 2 months, pain comes and goes. Patient states that she has had a previous surgery on her back in that area.    Relevant past medical, surgical, family and social history reviewed and updated as indicated. Interim medical history since our last visit reviewed. Allergies and medications reviewed and updated.  Review of Systems  Gastrointestinal:  Negative for abdominal pain and bowel incontinence.  Genitourinary:  Negative for bladder incontinence and pelvic pain.  Musculoskeletal:  Positive for back pain.  Neurological:  Negative for tingling, weakness, numbness and paresthesias.   Per HPI unless specifically indicated above     Objective:    BP 121/80   Pulse 97   Temp 98.2 F (36.8 C) (Oral)   Ht 5' 2.99"  (1.6 m)   Wt 187 lb 3.2 oz (84.9 kg)   LMP  (LMP Unknown)   SpO2 96%   BMI 33.17 kg/m   Wt Readings from Last 3 Encounters:  07/16/21 187 lb 3.2 oz (84.9 kg)  06/09/21 189 lb (85.7 kg)  02/12/21 192 lb (87.1 kg)    Physical Exam Vitals and nursing note reviewed.  Constitutional:      General: She is not in acute distress.    Appearance: Normal appearance. She is not ill-appearing or diaphoretic.  Eyes:     Conjunctiva/sclera: Conjunctivae normal.  Pulmonary:     Breath sounds: No rhonchi.  Abdominal:     General: Abdomen is flat. Bowel sounds are normal. There is no distension.     Palpations: Abdomen is soft. There is no mass.     Tenderness: There is no abdominal tenderness. There is no guarding.  Skin:    General: Skin is warm and dry.     Coloration: Skin is not jaundiced.     Findings: No erythema.  Neurological:     Mental Status: She is alert.    Results for orders placed or performed in visit on 07/16/21  Microscopic Examination   Urine  Result Value Ref Range   WBC, UA 0-5 0 - 5 /hpf  RBC 11-30 (A) 0 - 2 /hpf   Epithelial Cells (non renal) 0-10 0 - 10 /hpf   Mucus, UA Present (A) Not Estab.   Bacteria, UA Few (A) None seen/Few  CBC with Differential/Platelet  Result Value Ref Range   WBC 5.4 3.4 - 10.8 x10E3/uL   RBC 4.82 3.77 - 5.28 x10E6/uL   Hemoglobin 13.5 11.1 - 15.9 g/dL   Hematocrit 40.1 34.0 - 46.6 %   MCV 83 79 - 97 fL   MCH 28.0 26.6 - 33.0 pg   MCHC 33.7 31.5 - 35.7 g/dL   RDW 13.7 11.7 - 15.4 %   Platelets 326 150 - 450 x10E3/uL   Neutrophils 36 Not Estab. %   Lymphs 53 Not Estab. %   Monocytes 8 Not Estab. %   Eos 2 Not Estab. %   Basos 1 Not Estab. %   Neutrophils Absolute 2.0 1.4 - 7.0 x10E3/uL   Lymphocytes Absolute 2.9 0.7 - 3.1 x10E3/uL   Monocytes Absolute 0.4 0.1 - 0.9 x10E3/uL   EOS (ABSOLUTE) 0.1 0.0 - 0.4 x10E3/uL   Basophils Absolute 0.0 0.0 - 0.2 x10E3/uL   Immature Granulocytes 0 Not Estab. %   Immature Grans (Abs) 0.0  0.0 - 0.1 x10E3/uL  Comprehensive metabolic panel  Result Value Ref Range   Glucose 136 (H) 70 - 99 mg/dL   BUN 14 8 - 27 mg/dL   Creatinine, Ser 0.97 0.57 - 1.00 mg/dL   eGFR 60 >59 mL/min/1.73   BUN/Creatinine Ratio 14 12 - 28   Sodium 145 (H) 134 - 144 mmol/L   Potassium 4.1 3.5 - 5.2 mmol/L   Chloride 108 (H) 96 - 106 mmol/L   CO2 23 20 - 29 mmol/L   Calcium 10.0 8.7 - 10.3 mg/dL   Total Protein 6.9 6.0 - 8.5 g/dL   Albumin 4.2 3.7 - 4.7 g/dL   Globulin, Total 2.7 1.5 - 4.5 g/dL   Albumin/Globulin Ratio 1.6 1.2 - 2.2   Bilirubin Total 0.2 0.0 - 1.2 mg/dL   Alkaline Phosphatase 124 (H) 44 - 121 IU/L   AST 45 (H) 0 - 40 IU/L   ALT 33 (H) 0 - 32 IU/L  Urinalysis, Routine w reflex microscopic  Result Value Ref Range   Specific Gravity, UA >1.030 (H) 1.005 - 1.030   pH, UA 5.5 5.0 - 7.5   Color, UA Yellow Yellow   Appearance Ur Cloudy (A) Clear   Leukocytes,UA Trace (A) Negative   Protein,UA Trace (A) Negative/Trace   Glucose, UA Negative Negative   Ketones, UA Trace (A) Negative   RBC, UA 3+ (A) Negative   Bilirubin, UA Negative Negative   Urobilinogen, Ur 0.2 0.2 - 1.0 mg/dL   Nitrite, UA Negative Negative   Microscopic Examination See below:         Current Outpatient Medications:  .  diclofenac Sodium (VOLTAREN) 1 % GEL, Apply 4 g topically 4 (four) times daily., Disp: 100 g, Rfl: 12 .  donepezil (ARICEPT) 10 MG tablet, Take half tablet (5 mg) daily for 2 weeks, then increase to the full tablet at 10 mg daily, Disp: 30 tablet, Rfl: 11 .  fluticasone (FLONASE) 50 MCG/ACT nasal spray, Place 2 sprays into both nostrils daily., Disp: 16 g, Rfl: 6 .  ibuprofen (ADVIL) 600 MG tablet, Take 1 tablet (600 mg total) by mouth every 8 (eight) hours as needed., Disp: 20 tablet, Rfl: 0 .  atorvastatin (LIPITOR) 10 MG tablet, TAKE 1 TABLET BY MOUTH ONCE  EVERY EVENING, Disp: 30 tablet, Rfl: 2 .  citalopram (CELEXA) 20 MG tablet, Take 1 tablet (20 mg total) by mouth in the morning.,  Disp: 30 tablet, Rfl: 5 .  fexofenadine (ALLEGRA ALLERGY) 180 MG tablet, Take 1 tablet (180 mg total) by mouth daily., Disp: 90 tablet, Rfl: 1 .  nortriptyline (PAMELOR) 10 MG capsule, Take 1 capsule (10 mg total) by mouth at bedtime., Disp: 30 capsule, Rfl: 2    Assessment & Plan:  Back pain  Will need to fu with orhto , Referral made for such Check UA  most likely musckulskelteal though adviced streches for back  Patient advised to take medication as directed here. Patient advised to rest initially and then slowly increase activity level. Monitor changes in symptoms such as numbness, tingling or weakness in legs, changes in bowel or bladder habits or worsening back pain. Proper ergonomics discussed. Referral to physical therapy as needed. Patient will call if symptoms worsen or if pain persists greater than 8 weeks.  HLD is on liptior for suhc will recheck labs not done since October.   HLD recheck FLP, check LFT's work on diet, SE of meds explained to pt. low fat and high fiber diet explained to pt.  Problem List Items Addressed This Visit   None Visit Diagnoses     Chronic left-sided low back pain, unspecified whether sciatica present    -  Primary   Relevant Medications   atorvastatin (LIPITOR) 10 MG tablet   citalopram (CELEXA) 20 MG tablet   nortriptyline (PAMELOR) 10 MG capsule   fexofenadine (ALLEGRA ALLERGY) 180 MG tablet   Other Relevant Orders   Ambulatory referral to Orthopedics   CBC with Differential/Platelet (Completed)   Comprehensive metabolic panel (Completed)   Urinalysis, Routine w reflex microscopic (Completed)   Microscopic Examination (Completed)   Depression, unspecified depression type       Relevant Medications   atorvastatin (LIPITOR) 10 MG tablet   citalopram (CELEXA) 20 MG tablet   nortriptyline (PAMELOR) 10 MG capsule   fexofenadine (ALLEGRA ALLERGY) 180 MG tablet   Other Relevant Orders   Ambulatory referral to Orthopedics   CBC with  Differential/Platelet (Completed)   Comprehensive metabolic panel (Completed)   Urinalysis, Routine w reflex microscopic (Completed)   Microscopic Examination (Completed)        Orders Placed This Encounter  Procedures  . Microscopic Examination  . CBC with Differential/Platelet  . Comprehensive metabolic panel  . Urinalysis, Routine w reflex microscopic  . Ambulatory referral to Orthopedics     Meds ordered this encounter  Medications  . atorvastatin (LIPITOR) 10 MG tablet    Sig: TAKE 1 TABLET BY MOUTH ONCE EVERY EVENING    Dispense:  30 tablet    Refill:  2  . citalopram (CELEXA) 20 MG tablet    Sig: Take 1 tablet (20 mg total) by mouth in the morning.    Dispense:  30 tablet    Refill:  5    Pl fill meds blister pak for medications. Thnx.  . nortriptyline (PAMELOR) 10 MG capsule    Sig: Take 1 capsule (10 mg total) by mouth at bedtime.    Dispense:  30 capsule    Refill:  2  . fexofenadine (ALLEGRA ALLERGY) 180 MG tablet    Sig: Take 1 tablet (180 mg total) by mouth daily.    Dispense:  90 tablet    Refill:  1     Follow up plan: No follow-ups on file.

## 2021-07-17 LAB — CBC WITH DIFFERENTIAL/PLATELET
Basophils Absolute: 0 10*3/uL (ref 0.0–0.2)
Basos: 1 %
EOS (ABSOLUTE): 0.1 10*3/uL (ref 0.0–0.4)
Eos: 2 %
Hematocrit: 40.1 % (ref 34.0–46.6)
Hemoglobin: 13.5 g/dL (ref 11.1–15.9)
Immature Grans (Abs): 0 10*3/uL (ref 0.0–0.1)
Immature Granulocytes: 0 %
Lymphocytes Absolute: 2.9 10*3/uL (ref 0.7–3.1)
Lymphs: 53 %
MCH: 28 pg (ref 26.6–33.0)
MCHC: 33.7 g/dL (ref 31.5–35.7)
MCV: 83 fL (ref 79–97)
Monocytes Absolute: 0.4 10*3/uL (ref 0.1–0.9)
Monocytes: 8 %
Neutrophils Absolute: 2 10*3/uL (ref 1.4–7.0)
Neutrophils: 36 %
Platelets: 326 10*3/uL (ref 150–450)
RBC: 4.82 x10E6/uL (ref 3.77–5.28)
RDW: 13.7 % (ref 11.7–15.4)
WBC: 5.4 10*3/uL (ref 3.4–10.8)

## 2021-07-17 LAB — COMPREHENSIVE METABOLIC PANEL
ALT: 33 IU/L — ABNORMAL HIGH (ref 0–32)
AST: 45 IU/L — ABNORMAL HIGH (ref 0–40)
Albumin/Globulin Ratio: 1.6 (ref 1.2–2.2)
Albumin: 4.2 g/dL (ref 3.7–4.7)
Alkaline Phosphatase: 124 IU/L — ABNORMAL HIGH (ref 44–121)
BUN/Creatinine Ratio: 14 (ref 12–28)
BUN: 14 mg/dL (ref 8–27)
Bilirubin Total: 0.2 mg/dL (ref 0.0–1.2)
CO2: 23 mmol/L (ref 20–29)
Calcium: 10 mg/dL (ref 8.7–10.3)
Chloride: 108 mmol/L — ABNORMAL HIGH (ref 96–106)
Creatinine, Ser: 0.97 mg/dL (ref 0.57–1.00)
Globulin, Total: 2.7 g/dL (ref 1.5–4.5)
Glucose: 136 mg/dL — ABNORMAL HIGH (ref 70–99)
Potassium: 4.1 mmol/L (ref 3.5–5.2)
Sodium: 145 mmol/L — ABNORMAL HIGH (ref 134–144)
Total Protein: 6.9 g/dL (ref 6.0–8.5)
eGFR: 60 mL/min/{1.73_m2} (ref >59)

## 2021-07-24 ENCOUNTER — Ambulatory Visit: Payer: Medicare PPO | Admitting: Surgery

## 2021-07-28 ENCOUNTER — Ambulatory Visit (INDEPENDENT_AMBULATORY_CARE_PROVIDER_SITE_OTHER): Payer: Medicare PPO | Admitting: Licensed Clinical Social Worker

## 2021-07-28 DIAGNOSIS — F0280 Dementia in other diseases classified elsewhere without behavioral disturbance: Secondary | ICD-10-CM

## 2021-07-28 DIAGNOSIS — F32A Depression, unspecified: Secondary | ICD-10-CM

## 2021-07-28 DIAGNOSIS — F4310 Post-traumatic stress disorder, unspecified: Secondary | ICD-10-CM

## 2021-07-28 DIAGNOSIS — F330 Major depressive disorder, recurrent, mild: Secondary | ICD-10-CM

## 2021-07-28 NOTE — Patient Instructions (Signed)
Visit Information  Thank you for taking time to visit with me today. Please don't hesitate to contact me if I can be of assistance to you before our next scheduled telephone appointment.  Following are the goals we discussed today:  Patient Goals/Self-Care Activities: Over the next 120 days Attend all scheduled appointments with providers Contact clinic with any additional questions or concerns Comply with medications, as prescribed  Our next appointment is by telephone on 08/25/21 at 10:00 AM  Please call the care guide team at [phone] if you need to cancel or reschedule your appointment.   If you are experiencing a Mental Health or Behavioral Health Crisis or need someone to talk to, please call the Suicide and Crisis Lifeline: 988 call 911   Patient verbalizes understanding of instructions and care plan provided today and agrees to view in MyChart. Active MyChart status and patient understanding of how to access instructions and care plan via MyChart confirmed with patient.     Erin Patrick, MSW, LCSW [facility] Care Management [place]  [facility] [email or web address] Phone [phone] 1:41 PM
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
## 2021-07-30 ENCOUNTER — Encounter: Payer: Self-pay | Admitting: Surgery

## 2021-07-30 ENCOUNTER — Ambulatory Visit: Payer: Medicare PPO | Admitting: Surgery

## 2021-07-30 ENCOUNTER — Ambulatory Visit: Payer: Self-pay

## 2021-07-30 VITALS — BP 134/74 | HR 76 | Ht 62.99 in | Wt 187.2 lb

## 2021-07-30 DIAGNOSIS — M533 Sacrococcygeal disorders, not elsewhere classified: Secondary | ICD-10-CM

## 2021-07-30 DIAGNOSIS — G8929 Other chronic pain: Secondary | ICD-10-CM

## 2021-07-30 DIAGNOSIS — M5441 Lumbago with sciatica, right side: Secondary | ICD-10-CM

## 2021-07-30 MED ORDER — METHOCARBAMOL 500 MG PO TABS: 500.0000 mg | ORAL_TABLET | Freq: Two times a day (BID) | ORAL | 0 refills | Status: DC | PRN
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
## 2021-08-06 ENCOUNTER — Other Ambulatory Visit: Payer: Self-pay

## 2021-08-06 ENCOUNTER — Emergency Department: Payer: Medicare PPO

## 2021-08-06 ENCOUNTER — Emergency Department
Admission: EM | Admit: 2021-08-06 | Discharge: 2021-08-06 | Discharge status: Against Medical Advice | Payer: Medicare PPO | Attending: Emergency Medicine | Admitting: Emergency Medicine

## 2021-08-06 ENCOUNTER — Encounter: Payer: Self-pay | Admitting: Emergency Medicine

## 2021-08-06 ENCOUNTER — Ambulatory Visit: Payer: Self-pay | Admitting: *Deleted

## 2021-08-06 DIAGNOSIS — R531 Weakness: Secondary | ICD-10-CM | POA: Diagnosis not present

## 2021-08-06 DIAGNOSIS — F0390 Unspecified dementia without behavioral disturbance: Secondary | ICD-10-CM | POA: Diagnosis not present

## 2021-08-06 DIAGNOSIS — Y900 Blood alcohol level of less than 20 mg/100 ml: Secondary | ICD-10-CM | POA: Insufficient documentation

## 2021-08-06 DIAGNOSIS — Z5321 Procedure and treatment not carried out due to patient leaving prior to being seen by health care provider: Secondary | ICD-10-CM | POA: Insufficient documentation

## 2021-08-06 DIAGNOSIS — M549 Dorsalgia, unspecified: Secondary | ICD-10-CM | POA: Diagnosis not present

## 2021-08-06 DIAGNOSIS — R29898 Other symptoms and signs involving the musculoskeletal system: Secondary | ICD-10-CM | POA: Diagnosis not present

## 2021-08-06 DIAGNOSIS — M25512 Pain in left shoulder: Secondary | ICD-10-CM | POA: Diagnosis not present

## 2021-08-06 LAB — PROTIME-INR
INR: 1 (ref 0.8–1.2)
Prothrombin Time: 13 seconds (ref 11.4–15.2)

## 2021-08-06 LAB — ETHANOL: Alcohol, Ethyl (B): <10 mg/dL (ref <10)

## 2021-08-06 LAB — CBC
HCT: 43.7 % (ref 36.0–46.0)
Hemoglobin: 13.9 g/dL (ref 12.0–15.0)
MCH: 27.5 pg (ref 26.0–34.0)
MCHC: 31.8 g/dL (ref 30.0–36.0)
MCV: 86.5 fL (ref 80.0–100.0)
Platelets: 297 10*3/uL (ref 150–400)
RBC: 5.05 MIL/uL (ref 3.87–5.11)
RDW: 14.3 % (ref 11.5–15.5)
WBC: 4.1 10*3/uL (ref 4.0–10.5)
nRBC: 0 % (ref 0.0–0.2)

## 2021-08-06 LAB — DIFFERENTIAL
Abs Immature Granulocytes: 0 10*3/uL (ref 0.00–0.07)
Basophils Absolute: 0 10*3/uL (ref 0.0–0.1)
Basophils Relative: 1 %
Eosinophils Absolute: 0.1 10*3/uL (ref 0.0–0.5)
Eosinophils Relative: 2 %
Immature Granulocytes: 0 %
Lymphocytes Relative: 63 %
Lymphs Abs: 2.6 10*3/uL (ref 0.7–4.0)
Monocytes Absolute: 0.4 10*3/uL (ref 0.1–1.0)
Monocytes Relative: 9 %
Neutro Abs: 1 10*3/uL — ABNORMAL LOW (ref 1.7–7.7)
Neutrophils Relative %: 25 %

## 2021-08-06 LAB — COMPREHENSIVE METABOLIC PANEL
ALT: 27 U/L (ref 0–44)
AST: 40 U/L (ref 15–41)
Albumin: 4 g/dL (ref 3.5–5.0)
Alkaline Phosphatase: 66 U/L (ref 38–126)
Anion gap: 6 (ref 5–15)
BUN: 12 mg/dL (ref 8–23)
CO2: 24 mmol/L (ref 22–32)
Calcium: 9.6 mg/dL (ref 8.9–10.3)
Chloride: 111 mmol/L (ref 98–111)
Creatinine, Ser: 0.95 mg/dL (ref 0.44–1.00)
GFR, Estimated: >60 mL/min (ref >60)
Glucose, Bld: 95 mg/dL (ref 70–99)
Potassium: 5.5 mmol/L — ABNORMAL HIGH (ref 3.5–5.1)
Sodium: 141 mmol/L (ref 135–145)
Total Bilirubin: 1 mg/dL (ref 0.3–1.2)
Total Protein: 7.6 g/dL (ref 6.5–8.1)

## 2021-08-06 LAB — APTT: aPTT: 31 seconds (ref 24–36)

## 2021-08-06 NOTE — Telephone Encounter (Signed)
Routing to provider, FYI. [facility] Nurse called 911 for the patient due to symptoms. Do not see ER note in chart as of yet.

## 2021-08-06 NOTE — Telephone Encounter (Signed)
  Chief Complaint: R sided pain/tingling in face/neck, weakness in R arm Symptoms: see above Frequency: 1 month- getting worse Pertinent Negatives: Patient denies  headache, dizziness, vision loss, double vision, changes in speech Disposition: [x] ED /[] Urgent Care (no appt availability in office) / [] Appointment(In office/virtual)/ []  [facility]/ [] Home Care/ [] Refused Recommended Disposition /[] [facility]/ []  Follow-up with PCP Additional Notes: Patient is at home alone- attempted to call daughter-no answer. 911 called to assist patient and take her to ED

## 2021-08-06 NOTE — Telephone Encounter (Signed)
Summary: face pain and tingling   Patient's daughter states patient is having rt side pain and tingling from face down to neck x1d, daughter is not with patient and requesting an appt w/ provider for today   Please cb patient at [phone]      Answer Assessment - Initial Assessment Questions 1. SYMPTOM: "What is the main symptom you are concerned about?" (e.g., weakness, numbness)     Right sided facial pain/tingling, weakness 2. ONSET: "When did this start?" (minutes, hours, days; while sleeping)     Off/on for 1-2 months 3. LAST NORMAL: "When was the last time you (the patient) were normal (no symptoms)?"     *No Answer* 4. PATTERN "Does this come and go, or has it been constant since it started?"  "Is it present now?"     Comes and goes- more often now 5. CARDIAC SYMPTOMS: "Have you had any of the following symptoms: chest pain, difficulty breathing, palpitations?"     no 6. NEUROLOGIC SYMPTOMS: "Have you had any of the following symptoms: headache, dizziness, vision loss, double vision, changes in speech, unsteady on your feet?"     Unsteady feet- 1 month 7. OTHER SYMPTOMS: "Do you have any other symptoms?"     no 8. PREGNANCY: "Is there any chance you are pregnant?" "When was your last menstrual period?"  Protocols used: Neurologic Deficit-A-AH

## 2021-08-06 NOTE — Telephone Encounter (Signed)
Reason for Disposition . [1] Numbness (i.e., loss of sensation) of the face, arm / hand, or leg / foot on one side of the body AND [2] sudden onset AND [3] present now  Protocols used: Neurologic Deficit-A-AH
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
## 2021-08-06 NOTE — ED Notes (Signed)
Unable to find patine in lobbies or outside.

## 2021-08-07 NOTE — Telephone Encounter (Signed)
Needs to go to the ER please let pt know    

## 2021-08-07 NOTE — Telephone Encounter (Signed)
Called and LVM asking for patient or her daughter to please return my call.   OK for [facility] to give them Dr. Gwynneth Albright message if they come back.

## 2021-08-08 NOTE — Telephone Encounter (Signed)
Called and LVM notifying patient and her daughter of Dr. Gwynneth Albright message.

## 2021-08-13 ENCOUNTER — Ambulatory Visit: Payer: Medicare PPO | Admitting: Internal Medicine

## 2021-08-14 ENCOUNTER — Ambulatory Visit: Payer: Medicare PPO | Admitting: Rehabilitative and Restorative Service Providers"

## 2021-08-14 ENCOUNTER — Ambulatory Visit: Payer: Self-pay

## 2021-08-14 NOTE — Telephone Encounter (Signed)
Called to speak with daughter about lab work, phone was answered and then hung up.

## 2021-08-14 NOTE — Telephone Encounter (Signed)
Summary: Lab results call back request   Pt's daughter called and is requesting to have a nurse call the patient and explain her lab work to her, please advise   Best contact: [phone]     Called pt and advised her that labs need to be read and interpreted by Dr. Charlotta Newton. Advised her it is outside my scope of practice to do so. Routing request to office to have labs reviewed and result note added to chart. Answer Assessment - Initial Assessment Questions 1. REASON FOR CALL or QUESTION: "What is your reason for calling today?" or "How can I best help you?" or "What question do you have that I can help answer?"     Summary: Lab results call back request   Pt's daughter called and is requesting to have a nurse call the patient and explain her lab work to her, please advise   Best contact: [phone]     2. CALLER: Document the source of call. (e.g., laboratory, patient).     Pt's wife  Protocols used: PCP Call - No Triage-A-AH

## 2021-08-15 DIAGNOSIS — F330 Major depressive disorder, recurrent, mild: Secondary | ICD-10-CM

## 2021-08-15 DIAGNOSIS — G309 Alzheimer's disease, unspecified: Secondary | ICD-10-CM

## 2021-08-15 DIAGNOSIS — F32A Depression, unspecified: Secondary | ICD-10-CM

## 2021-08-15 DIAGNOSIS — F0280 Dementia in other diseases classified elsewhere without behavioral disturbance: Secondary | ICD-10-CM

## 2021-08-24 ENCOUNTER — Other Ambulatory Visit: Payer: Self-pay

## 2021-08-24 ENCOUNTER — Emergency Department: Payer: Medicare PPO

## 2021-08-24 ENCOUNTER — Emergency Department
Admission: EM | Admit: 2021-08-24 | Discharge: 2021-08-25 | Disposition: A | Discharge status: Home / Self Care | Payer: Medicare PPO | Attending: Emergency Medicine | Admitting: Emergency Medicine

## 2021-08-24 DIAGNOSIS — N132 Hydronephrosis with renal and ureteral calculous obstruction: Secondary | ICD-10-CM | POA: Insufficient documentation

## 2021-08-24 DIAGNOSIS — R109 Unspecified abdominal pain: Secondary | ICD-10-CM | POA: Diagnosis present

## 2021-08-24 DIAGNOSIS — N200 Calculus of kidney: Secondary | ICD-10-CM

## 2021-08-24 LAB — COMPREHENSIVE METABOLIC PANEL
ALT: 25 U/L (ref 0–44)
AST: 29 U/L (ref 15–41)
Albumin: 4.2 g/dL (ref 3.5–5.0)
Alkaline Phosphatase: 79 U/L (ref 38–126)
Anion gap: 9 (ref 5–15)
BUN: 12 mg/dL (ref 8–23)
CO2: 26 mmol/L (ref 22–32)
Calcium: 9.3 mg/dL (ref 8.9–10.3)
Chloride: 109 mmol/L (ref 98–111)
Creatinine, Ser: 1.3 mg/dL — ABNORMAL HIGH (ref 0.44–1.00)
GFR, Estimated: 42 mL/min — ABNORMAL LOW (ref >60)
Glucose, Bld: 144 mg/dL — ABNORMAL HIGH (ref 70–99)
Potassium: 3.5 mmol/L (ref 3.5–5.1)
Sodium: 144 mmol/L (ref 135–145)
Total Bilirubin: 0.8 mg/dL (ref 0.3–1.2)
Total Protein: 7.3 g/dL (ref 6.5–8.1)

## 2021-08-24 LAB — CBC
HCT: 41.8 % (ref 36.0–46.0)
Hemoglobin: 13.1 g/dL (ref 12.0–15.0)
MCH: 27.6 pg (ref 26.0–34.0)
MCHC: 31.3 g/dL (ref 30.0–36.0)
MCV: 88 fL (ref 80.0–100.0)
Platelets: 281 10*3/uL (ref 150–400)
RBC: 4.75 MIL/uL (ref 3.87–5.11)
RDW: 14.2 % (ref 11.5–15.5)
WBC: 8.6 10*3/uL (ref 4.0–10.5)
nRBC: 0 % (ref 0.0–0.2)

## 2021-08-24 MED ORDER — HALOPERIDOL LACTATE 5 MG/ML IJ SOLN
2.5000 mg | Freq: Once | INTRAMUSCULAR | Status: AC
Start: 2021-08-24 — End: 2021-08-24
  Administered 2021-08-24: 2.5 mg via INTRAVENOUS
  Filled 2021-08-24: qty 1

## 2021-08-24 MED ORDER — IOHEXOL 300 MG/ML  SOLN
100.0000 mL | Freq: Once | INTRAMUSCULAR | Status: AC | PRN
Start: 2021-08-24 — End: 2021-08-24
  Administered 2021-08-24: 100 mL via INTRAVENOUS

## 2021-08-24 MED ORDER — OXYCODONE-ACETAMINOPHEN 5-325 MG PO TABS: 1.0000 | ORAL_TABLET | Freq: Four times a day (QID) | ORAL | 0 refills | Status: DC | PRN

## 2021-08-24 MED ORDER — SODIUM CHLORIDE 0.9 % IV BOLUS
1000.0000 mL | Freq: Once | INTRAVENOUS | Status: AC
  Administered 2021-08-24: 1000 mL via INTRAVENOUS

## 2021-08-24 MED ORDER — TAMSULOSIN HCL 0.4 MG PO CAPS: 0.4000 mg | ORAL_CAPSULE | Freq: Every day | ORAL | 0 refills | Status: DC

## 2021-08-24 MED ORDER — OXYCODONE-ACETAMINOPHEN 5-325 MG PO TABS
1.0000 | ORAL_TABLET | Freq: Once | ORAL | Status: AC
  Administered 2021-08-25: 1 via ORAL
  Filled 2021-08-24: qty 1

## 2021-08-24 MED ORDER — MORPHINE SULFATE (PF) 4 MG/ML IV SOLN
4.0000 mg | Freq: Once | INTRAVENOUS | Status: AC
  Administered 2021-08-24: 4 mg via INTRAVENOUS
  Filled 2021-08-24: qty 1

## 2021-08-24 NOTE — Discharge Instructions (Addendum)
Please seek medical attention for any high fevers, chest pain, shortness of breath, change in behavior, persistent vomiting, bloody stool or any other new or concerning symptoms.  

## 2021-08-24 NOTE — ED Triage Notes (Signed)
BIB EMS from home.  Pt was seen at [facility] last mnight for same. Kidney stone dianosis. LLQ abd pain. Pt specifically asked for fentanyl to EMS. Daughter advised pt has some early stages of dementia.  Vitals: 160/100 99 HR  100% on RA

## 2021-08-24 NOTE — ED Provider Notes (Signed)
[facility] Provider Note    Event Date/Time   First MD Initiated Contact with Patient 08/24/21 2142     (approximate)   History   Abdominal Pain   HPI {Remember to add pertinent medical, surgical, social, and/or OB history to HPI:1} Erin Patrick is a 76 y.o. female  ***       Physical Exam   Triage Vital Signs: ED Triage Vitals  Enc Vitals Group     BP 08/24/21 2057 (!) 153/77     Pulse Rate 08/24/21 2057 95     Resp 08/24/21 2057 16     Temp 08/24/21 2054 98.6 F (37 C)     Temp Source 08/24/21 2054 Oral     SpO2 08/24/21 2057 95 %     Weight 08/24/21 2055 187 lb 2.7 oz (84.9 kg)     Height 08/24/21 2055 5\' 2"  (1.575 m)     Head Circumference --      Peak Flow --      Pain Score 08/24/21 2055 10     Pain Loc --      Pain Edu? --      Excl. in GC? --     Most recent vital signs: Vitals:   08/24/21 2054 08/24/21 2057  BP:  (!) 153/77  Pulse:  95  Resp:  16  Temp: 98.6 F (37 C)   SpO2:  95%    {Only need to document appropriate and relevant physical exam:1} General: Awake, no distress. *** CV:  Good peripheral perfusion. *** Resp:  Normal effort. *** Abd:  No distention. *** Other:  ***   ED Results / Procedures / Treatments   Labs (all labs ordered are listed, but only abnormal results are displayed) Labs Reviewed  COMPREHENSIVE METABOLIC PANEL - Abnormal; Notable for the following components:      Result Value   Glucose, Bld 144 (*)    Creatinine, Ser 1.30 (*)    GFR, Estimated 42 (*)    All other components within normal limits  CBC  URINALYSIS, ROUTINE W REFLEX MICROSCOPIC     EKG  ***   RADIOLOGY *** {USE THE WORD "INTERPRETED"!! You MUST document your own interpretation of imaging, as well as the fact that you reviewed the radiologist's report!:1}   PROCEDURES:  Critical Care performed: {CriticalCareYesNo:19197::"Yes, see critical care procedure note(s)","No"}  Procedures   MEDICATIONS ORDERED  IN ED: Medications - No data to display   IMPRESSION / MDM / ASSESSMENT AND PLAN / ED COURSE  I reviewed the triage vital signs and the nursing notes.                              Differential diagnosis includes, but is not limited to, ***  Patient's presentation is most consistent with {EM COPA:27473}  {If the patient is on the monitor, remove the brackets and asterisks on the sentence below and remember to document it as a Procedure as well. Otherwise delete the sentence below:1} {**The patient is on the cardiac monitor to evaluate for evidence of arrhythmia and/or significant heart rate changes.**} {Remember to include, when applicable, any/all of the following data: independent review of imaging independent review of labs (comment specifically on pertinent positives and negatives) review of specific prior hospitalizations, PCP/specialist notes, etc. discuss meds given and prescribed document any discussion with consultants (including hospitalists) any clinical decision tools you used and why (PECARN, NEXUS, etc.) did you  consider admitting the patient? document social determinants of health affecting patient's care (homelessness, inability to follow up in a timely fashion, etc) document any pre-existing conditions increasing risk on current visit (e.g. diabetes and HTN increasing danger of high-risk chest pain/ACS) describes what meds you gave (especially parenteral) and why any other interventions?:1}     FINAL CLINICAL IMPRESSION(S) / ED DIAGNOSES   Final diagnoses:  None     Rx / DC Orders   ED Discharge Orders     None        Note:  This document was prepared using Dragon voice recognition software and may include unintentional dictation errors.

## 2021-08-25 ENCOUNTER — Ambulatory Visit: Payer: Medicare PPO | Admitting: Licensed Clinical Social Worker

## 2021-08-25 DIAGNOSIS — F330 Major depressive disorder, recurrent, mild: Secondary | ICD-10-CM

## 2021-08-25 DIAGNOSIS — F4310 Post-traumatic stress disorder, unspecified: Secondary | ICD-10-CM

## 2021-08-29 NOTE — Patient Instructions (Signed)
Visit Information  Thank you for taking time to visit with me today. Please don't hesitate to contact me if I can be of assistance to you before our next scheduled telephone appointment.  Following are the goals we discussed today:  Patient Goals/Self-Care Activities: Over the next 120 days Attend all scheduled appointments with providers Contact clinic with any additional questions or concerns Comply with medications, as prescribed  Follow up with Care Coordination   If you are experiencing a Mental Health or Behavioral Health Crisis or need someone to talk to, please call the Suicide and Crisis Lifeline: 988 call 911   Patient verbalizes understanding of instructions and care plan provided today and agrees to view in MyChart. Active MyChart status and patient understanding of how to access instructions and care plan via MyChart confirmed with patient.     [name], MSW, LCSW [facility] Care Management [place]  [facility] [email or web address] Phone [phone] 3:13 PM
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
## 2021-09-01 ENCOUNTER — Ambulatory Visit: Payer: Self-pay | Admitting: Family

## 2021-09-01 NOTE — Telephone Encounter (Signed)
Reason for Disposition  MODERATE pain (e.g., interferes with normal activities or awakens from sleep)    Known kidney stone from ED visit  Protocols used: Flank Pain-A-AH  (For some reason the protocol for flank pain is not coming up, reason free text is used.   The protocol questions are not coming up)  Daughter, Erin Patrick calling in but mother with her.   Mother was seen in the ED 7 days ago and diagnosed with a kidney stone.  Over the weekend she has been in a lot of pain.   The pain medication prescribed for her in the ED is helping with the pain but she needs to follow up with her PCP per the ED doctor.    Erin Patrick denies that her mother is having blood in her urine.  "Just having the terrible pain in her back from the kidney stone".  Appt made with Erin Cirri, NP for 7/18/2-23 at 9:40 with instructions to go to the ED if she noticed blood in her urine or could not urinate.   Also if the pain medication does stops controlling the pain to go on to the ED for relief of the pain with stronger pain medication.   Erin Patrick was agreeable to this plan.

## 2021-09-02 ENCOUNTER — Ambulatory Visit: Payer: Medicare PPO | Admitting: Unknown Physician Specialty

## 2021-09-03 ENCOUNTER — Telehealth: Payer: Self-pay | Admitting: *Deleted

## 2021-09-03 NOTE — Telephone Encounter (Signed)
Spoke with daughter [name] her mother has see a doctor at [facility] in [place] who placed a referral to a urologist, they are waiting on call.  States she did not know about appointment that was scheduled with wicker on 09-02-2021

## 2021-09-05 ENCOUNTER — Ambulatory Visit (INDEPENDENT_AMBULATORY_CARE_PROVIDER_SITE_OTHER): Payer: Medicare PPO | Admitting: Family Medicine

## 2021-09-05 ENCOUNTER — Ambulatory Visit: Payer: Self-pay | Admitting: *Deleted

## 2021-09-05 ENCOUNTER — Encounter: Payer: Self-pay | Admitting: Family Medicine

## 2021-09-05 VITALS — BP 132/84 | HR 88 | Temp 97.6°F | Wt 175.4 lb

## 2021-09-05 DIAGNOSIS — B029 Zoster without complications: Secondary | ICD-10-CM

## 2021-09-05 MED ORDER — VALACYCLOVIR HCL 1 G PO TABS: 1000.0000 mg | ORAL_TABLET | Freq: Two times a day (BID) | ORAL | 0 refills | Status: AC

## 2021-09-05 MED ORDER — OXYCODONE-ACETAMINOPHEN 5-325 MG PO TABS: 1.0000 | ORAL_TABLET | Freq: Three times a day (TID) | ORAL | 0 refills | Status: DC | PRN

## 2021-09-05 MED ORDER — TRIAMCINOLONE ACETONIDE 40 MG/ML IJ SUSP
40.0000 mg | Freq: Once | INTRAMUSCULAR | Status: AC
  Administered 2021-09-05: 40 mg via INTRAMUSCULAR

## 2021-09-05 MED ORDER — PREDNISONE 10 MG PO TABS: ORAL_TABLET | ORAL | 0 refills | Status: DC

## 2021-09-05 MED ORDER — AMITRIPTYLINE HCL 25 MG PO TABS: 25.0000 mg | ORAL_TABLET | Freq: Every day | ORAL | 3 refills | Status: DC

## 2021-09-05 NOTE — Progress Notes (Signed)
BP 132/84   Pulse 88   Temp 97.6 F (36.4 C) (Oral)   Wt 175 lb 6.4 oz (79.6 kg)   LMP  (LMP Unknown)   BMI 32.08 kg/m    Subjective:    Patient ID: Erin Patrick, female    DOB: [birth year], 76 y.o.   MRN: [id number]  HPI: Erin Patrick is a 76 y.o. female  Chief Complaint  Patient presents with   Rash    Onset x a few days, rash located on R flank - patient states she is in a lot of pain. Daughter states patient also has kidney stones.    RASH Duration:  few days  Location: R flank  Itching: yes Burning: yes Redness: yes Oozing: no Scaling: yes Blisters: yes Painful: yes Fevers: no Change in detergents/soaps/personal care products: no Recent illness: no Recent travel:no History of same: no Context: worse Alleviating factors: nothing Treatments attempted:nothing Shortness of breath: no  Throat/tongue swelling: no Myalgias/arthralgias: no  Relevant past medical, surgical, family and social history reviewed and updated as indicated. Interim medical history since our last visit reviewed. Allergies and medications reviewed and updated.  Review of Systems  Constitutional: Negative.   Cardiovascular: Negative.   Gastrointestinal: Negative.   Musculoskeletal: Negative.   Skin:  Positive for rash. Negative for color change, pallor and wound.  Neurological: Negative.   Psychiatric/Behavioral: Negative.      Per HPI unless specifically indicated above     Objective:    BP 132/84   Pulse 88   Temp 97.6 F (36.4 C) (Oral)   Wt 175 lb 6.4 oz (79.6 kg)   LMP  (LMP Unknown)   BMI 32.08 kg/m   Wt Readings from Last 3 Encounters:  09/05/21 175 lb 6.4 oz (79.6 kg)  08/24/21 187 lb 2.7 oz (84.9 kg)  07/30/21 187 lb 3.2 oz (84.9 kg)    Physical Exam Vitals and nursing note reviewed.  Constitutional:      General: She is not in acute distress.    Appearance: Normal appearance. She is not ill-appearing, toxic-appearing or diaphoretic.  HENT:     Head:  Normocephalic and atraumatic.     Right Ear: External ear normal.     Left Ear: External ear normal.     Nose: Nose normal.     Mouth/Throat:     Mouth: Mucous membranes are moist.     Pharynx: Oropharynx is clear.  Eyes:     General: No scleral icterus.       Right eye: No discharge.        Left eye: No discharge.     Extraocular Movements: Extraocular movements intact.     Conjunctiva/sclera: Conjunctivae normal.     Pupils: Pupils are equal, round, and reactive to light.  Cardiovascular:     Rate and Rhythm: Normal rate and regular rhythm.     Pulses: Normal pulses.     Heart sounds: Normal heart sounds. No murmur heard.    No friction rub. No gallop.  Pulmonary:     Effort: Pulmonary effort is normal. No respiratory distress.     Breath sounds: Normal breath sounds. No stridor. No wheezing, rhonchi or rales.  Chest:     Chest wall: No tenderness.  Musculoskeletal:        General: Normal range of motion.     Cervical back: Normal range of motion and neck supple.  Skin:    General: Skin is warm and dry.     Capillary  Refill: Capillary refill takes less than 2 seconds.     Coloration: Skin is not jaundiced or pale.     Findings: No bruising, erythema, lesion or rash.     Comments: Vesicular rash on R flank  Neurological:     General: No focal deficit present.     Mental Status: She is alert and oriented to person, place, and time. Mental status is at baseline.  Psychiatric:        Mood and Affect: Mood normal.        Behavior: Behavior normal.        Thought Content: Thought content normal.        Judgment: Judgment normal.     Results for orders placed or performed during the hospital encounter of 08/24/21  Comprehensive metabolic panel  Result Value Ref Range   Sodium 144 135 - 145 mmol/L   Potassium 3.5 3.5 - 5.1 mmol/L   Chloride 109 98 - 111 mmol/L   CO2 26 22 - 32 mmol/L   Glucose, Bld 144 (H) 70 - 99 mg/dL   BUN 12 8 - 23 mg/dL   Creatinine, Ser 7.67 (H)  0.44 - 1.00 mg/dL   Calcium 9.3 8.9 - 34.1 mg/dL   Total Protein 7.3 6.5 - 8.1 g/dL   Albumin 4.2 3.5 - 5.0 g/dL   AST 29 15 - 41 U/L   ALT 25 0 - 44 U/L   Alkaline Phosphatase 79 38 - 126 U/L   Total Bilirubin 0.8 0.3 - 1.2 mg/dL   GFR, Estimated 42 (L) >60 mL/min   Anion gap 9 5 - 15  CBC  Result Value Ref Range   WBC 8.6 4.0 - 10.5 K/uL   RBC 4.75 3.87 - 5.11 MIL/uL   Hemoglobin 13.1 12.0 - 15.0 g/dL   HCT 93.7 90.2 - 40.9 %   MCV 88.0 80.0 - 100.0 fL   MCH 27.6 26.0 - 34.0 pg   MCHC 31.3 30.0 - 36.0 g/dL   RDW 73.5 32.9 - 92.4 %   Platelets 281 150 - 400 K/uL   nRBC 0.0 0.0 - 0.2 %      Assessment & Plan:   Problem List Items Addressed This Visit   None Visit Diagnoses     Herpes zoster without complication    -  Primary   Will treat with valacyclovir, prednisone, amitriptyline and PRN oxycodone. Call with any concerns or if not getting better. Recheck 1 week given pain.   Relevant Medications   valACYclovir (VALTREX) 1000 MG tablet   triamcinolone acetonide (KENALOG-40) injection 40 mg (Start on 09/05/2021  4:30 PM)        Follow up plan: Return in about 1 week (around 09/12/2021) for follow up shingles with whoever is available.

## 2021-09-05 NOTE — Telephone Encounter (Signed)
  Chief Complaint: new onset rash Symptoms: blisters, red rash, pain Frequency: noticed today Pertinent Negatives: Patient denies fever Disposition: [] ED /[] Urgent Care (no appt availability in office) / [x] Appointment(In office/virtual)/ []  [facility]/ [] Home Care/ [] Refused Recommended Disposition /[] [place] [facility] [facility]/ []  Follow-up with PCP Additional Notes:

## 2021-09-05 NOTE — Patient Instructions (Signed)
Do not start the prednisone until tomorrow morning.  Take the valacyclovir as soon as you get it. It will be 2x a day Take the amitriptyline at bed time.  Start the prednisone tomorrow morning with food Take the pain medicine as needed for severe pain.

## 2021-09-05 NOTE — Telephone Encounter (Signed)
Reason for Disposition  [1] Localized rash is very painful AND [2] no fever  Answer Assessment - Initial Assessment Questions 1. APPEARANCE of RASH: "Describe the rash."      Red, blisters 2. LOCATION: "Where is the rash located?"      R side 3. NUMBER: "How many spots are there?"      Multiple blister 4. SIZE: "How big are the spots?" (Inches, centimeters or compare to size of a coin)      Plate size 5. ONSET: "When did the rash start?"      Noticed today- unsure 6. ITCHING: "Does the rash itch?" If Yes, ask: "How bad is the itch?"  (Scale 0-10; or none, mild, moderate, severe)     no 7. PAIN: "Does the rash hurt?" If Yes, ask: "How bad is the pain?"  (Scale 0-10; or none, mild, moderate, severe)    - NONE (0): no pain    - MILD (1-3): doesn't interfere with normal activities     - MODERATE (4-7): interferes with normal activities or awakens from sleep     - SEVERE (8-10): excruciating pain, unable to do any normal activities     Yes- severe 8. OTHER SYMPTOMS: "Do you have any other symptoms?" (e.g., fever)     unsure 9. PREGNANCY: "Is there any chance you are pregnant?" "When was your last menstrual period?"  Protocols used: Rash or Redness - Localized-A-AH

## 2021-09-07 DIAGNOSIS — E1169 Type 2 diabetes mellitus with other specified complication: Secondary | ICD-10-CM | POA: Insufficient documentation

## 2021-09-07 NOTE — Patient Instructions (Signed)

## 2021-09-12 ENCOUNTER — Encounter: Payer: Self-pay | Admitting: Nurse Practitioner

## 2021-09-12 ENCOUNTER — Ambulatory Visit (INDEPENDENT_AMBULATORY_CARE_PROVIDER_SITE_OTHER): Payer: Medicare PPO | Admitting: Nurse Practitioner

## 2021-09-12 DIAGNOSIS — E1169 Type 2 diabetes mellitus with other specified complication: Secondary | ICD-10-CM

## 2021-09-12 DIAGNOSIS — B029 Zoster without complications: Secondary | ICD-10-CM | POA: Diagnosis not present

## 2021-09-12 MED ORDER — GABAPENTIN 100 MG PO CAPS: 100.0000 mg | ORAL_CAPSULE | Freq: Two times a day (BID) | ORAL | 4 refills | Status: DC

## 2021-09-12 NOTE — Progress Notes (Signed)
BP 112/71   Pulse 97   Temp 98 F (36.7 C) (Oral)   Ht 5\' 2"  (1.575 m)   Wt 176 lb 14.4 oz (80.2 kg)   LMP  (LMP Unknown)   SpO2 99%   BMI 32.36 kg/m    Subjective:    Patient ID: , female    DOB: 1945-03-14, 76 y.o.   MRN: [id number]  HPI: Erin Patrick is a 76 y.o. female  Chief Complaint  Patient presents with   Herpes Zoster    Patient is here for a one week for follow up on Shingles. Patient says she is feeling better than she did last week when she came.    Daughter out in car and not present in room to assist with HPI.  SHINGLES Treated on 09/05/21 for shingles outbreak with Prednisone and Amitriptyline + Valtrex + short burst of Norco.  This was to her right flank. Location:  right flank Painful: minimal Severity: mild  Paresthesia:  no Hyperesthesia: no Itching:   sometimes Burning:   sometimes Oozing:  no Blisters:  no Fevers:  no History of the same:  no Alleviating factors: cold water Status: stable Treatments attempted: as above Estimated Creatinine Clearance: 35 mL/min (A) (by C-G formula based on SCr of 1.3 mg/dL (H)).   Relevant past medical, surgical, family and social history reviewed and updated as indicated. Interim medical history since our last visit reviewed. Allergies and medications reviewed and updated.  Review of Systems  Constitutional:  Negative for activity change, appetite change, diaphoresis, fatigue and fever.  Respiratory:  Negative for cough, chest tightness and shortness of breath.   Cardiovascular:  Negative for chest pain, palpitations and leg swelling.  Gastrointestinal: Negative.   Skin:  Positive for rash.  Neurological: Negative.   Psychiatric/Behavioral: Negative.      Per HPI unless specifically indicated above     Objective:    BP 112/71   Pulse 97   Temp 98 F (36.7 C) (Oral)   Ht 5\' 2"  (1.575 m)   Wt 176 lb 14.4 oz (80.2 kg)   LMP  (LMP Unknown)   SpO2 99%   BMI 32.36 kg/m   Wt Readings  from Last 3 Encounters:  09/12/21 176 lb 14.4 oz (80.2 kg)  09/05/21 175 lb 6.4 oz (79.6 kg)  08/24/21 187 lb 2.7 oz (84.9 kg)    Physical Exam Vitals and nursing note reviewed.  Constitutional:      General: She is awake. She is not in acute distress.    Appearance: She is well-developed and well-groomed. She is obese. She is not ill-appearing or toxic-appearing.  HENT:     Head: Normocephalic.     Right Ear: Hearing normal.     Left Ear: Hearing normal.  Eyes:     General: Lids are normal.        Right eye: No discharge.        Left eye: No discharge.     Conjunctiva/sclera: Conjunctivae normal.     Pupils: Pupils are equal, round, and reactive to light.  Neck:     Thyroid: No thyromegaly.     Vascular: No carotid bruit.  Cardiovascular:     Rate and Rhythm: Normal rate and regular rhythm.     Heart sounds: Normal heart sounds. No murmur heard.    No gallop.  Pulmonary:     Effort: Pulmonary effort is normal. No accessory muscle usage or respiratory distress.     Breath sounds:  Normal breath sounds.  Abdominal:     General: Bowel sounds are normal.     Palpations: Abdomen is soft.  Musculoskeletal:     Cervical back: Normal range of motion and neck supple.     Right lower leg: No edema.     Left lower leg: No edema.  Skin:    General: Skin is warm and dry.     Findings: Rash present.     Comments: To right flank, area with significant crusting.  One small blister that is open and drying up, crusting around remainder of area.  No erythema or warmth.  No other rashes noted.  Neurological:     Mental Status: She is alert and oriented to person, place, and time.  Psychiatric:        Attention and Perception: Attention normal.        Mood and Affect: Mood normal.        Speech: Speech normal.        Behavior: Behavior normal. Behavior is cooperative.        Thought Content: Thought content normal.        Cognition and Memory: Memory is impaired.    Results for orders  placed or performed during the hospital encounter of 08/24/21  Comprehensive metabolic panel  Result Value Ref Range   Sodium 144 135 - 145 mmol/L   Potassium 3.5 3.5 - 5.1 mmol/L   Chloride 109 98 - 111 mmol/L   CO2 26 22 - 32 mmol/L   Glucose, Bld 144 (H) 70 - 99 mg/dL   BUN 12 8 - 23 mg/dL   Creatinine, Ser 3.23 (H) 0.44 - 1.00 mg/dL   Calcium 9.3 8.9 - 55.7 mg/dL   Total Protein 7.3 6.5 - 8.1 g/dL   Albumin 4.2 3.5 - 5.0 g/dL   AST 29 15 - 41 U/L   ALT 25 0 - 44 U/L   Alkaline Phosphatase 79 38 - 126 U/L   Total Bilirubin 0.8 0.3 - 1.2 mg/dL   GFR, Estimated 42 (L) >60 mL/min   Anion gap 9 5 - 15  CBC  Result Value Ref Range   WBC 8.6 4.0 - 10.5 K/uL   RBC 4.75 3.87 - 5.11 MIL/uL   Hemoglobin 13.1 12.0 - 15.0 g/dL   HCT 32.2 02.5 - 42.7 %   MCV 88.0 80.0 - 100.0 fL   MCH 27.6 26.0 - 34.0 pg   MCHC 31.3 30.0 - 36.0 g/dL   RDW 06.2 37.6 - 28.3 %   Platelets 281 150 - 400 K/uL   nRBC 0.0 0.0 - 0.2 %      Assessment & Plan:   Problem List Items Addressed This Visit       Other   Shingles    Overall improving at this time, initially presented on 09/05/21.  Is drying up at this time with only 1 small blister.  Crusting to remainder of area.  Some ongoing discomfort.  Will stop Amitriptyline and start Gabapentin 100 MG BID for discomfort (renal dose total allowed 900 MG daily based on current CrCl 35).  Recommend no touching/scratching at area.  Will have return in 3 weeks for chronic disease visit and labs + recheck of rash.         Follow up plan: Return in about 3 weeks (around 10/03/2021) for T2DM, HTN/HLD, DEMENTIA, SHINGLES -- with Elnita Maxwell or Erin for chronic disease visit.

## 2021-09-12 NOTE — Assessment & Plan Note (Signed)
Overall improving at this time, initially presented on 09/05/21.  Is drying up at this time with only 1 small blister.  Crusting to remainder of area.  Some ongoing discomfort.  Will stop Amitriptyline and start Gabapentin 100 MG BID for discomfort (renal dose total allowed 900 MG daily based on current CrCl 35).  Recommend no touching/scratching at area.  Will have return in 3 weeks for chronic disease visit and labs + recheck of rash.

## 2021-09-15 ENCOUNTER — Encounter: Payer: Self-pay | Admitting: Licensed Clinical Social Worker

## 2021-09-25 ENCOUNTER — Other Ambulatory Visit: Payer: Self-pay

## 2021-09-25 DIAGNOSIS — N200 Calculus of kidney: Secondary | ICD-10-CM

## 2021-09-26 ENCOUNTER — Ambulatory Visit
Admission: RE | Admit: 2021-09-26 | Discharge: 2021-09-26 | Disposition: A | Discharge status: Home / Self Care | Payer: Medicare PPO | Attending: Urology | Admitting: Urology

## 2021-09-26 ENCOUNTER — Other Ambulatory Visit
Admission: RE | Admit: 2021-09-26 | Discharge: 2021-09-26 | Disposition: A | Discharge status: Home / Self Care | Payer: Medicare PPO | Source: Home / Self Care | Attending: Urology | Admitting: Urology

## 2021-09-26 ENCOUNTER — Ambulatory Visit: Payer: Medicare PPO | Admitting: Urology

## 2021-09-26 ENCOUNTER — Ambulatory Visit
Admission: RE | Admit: 2021-09-26 | Discharge: 2021-09-26 | Disposition: A | Discharge status: Home / Self Care | Payer: Medicare PPO | Source: Ambulatory Visit | Attending: Urology | Admitting: Urology

## 2021-09-26 ENCOUNTER — Encounter: Payer: Self-pay | Admitting: Urology

## 2021-09-26 VITALS — BP 138/76 | HR 109 | Ht 63.0 in | Wt 179.0 lb

## 2021-09-26 DIAGNOSIS — N200 Calculus of kidney: Secondary | ICD-10-CM

## 2021-09-26 LAB — URINALYSIS, COMPLETE (UACMP) WITH MICROSCOPIC
Glucose, UA: NEGATIVE mg/dL
Ketones, ur: NEGATIVE mg/dL
Nitrite: NEGATIVE
Specific Gravity, Urine: 1.025 (ref 1.005–1.030)
pH: 5.5 (ref 5.0–8.0)
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
## 2021-09-27 LAB — URINE CULTURE

## 2021-09-30 ENCOUNTER — Telehealth: Payer: Self-pay | Admitting: *Deleted

## 2021-09-30 NOTE — Telephone Encounter (Addendum)
Attempted to leave VM, unable to leave VM    ----- Message from Vanna Scotland, MD sent at 09/29/2021  8:48 AM EDT ----- Your kidney stone is not seen on this x-ray.  I believe you likely passed it.  Great news.  Vanna Scotland, MD

## 2021-10-03 ENCOUNTER — Ambulatory Visit: Payer: Medicare PPO | Admitting: Physician Assistant

## 2021-10-03 NOTE — Progress Notes (Deleted)
Established Patient Office Visit  Name: Erin Patrick   MRN: [id number]    DOB: 24-Jan-1946   Date:10/03/2021  Today's Provider: Talitha Givens, MHS, PA-C Introduced myself to the patient as a PA-C and provided education on APPs in clinical practice.         Subjective  Chief Complaint  No chief complaint on file.   HPI  Diabetes, Type 2 - Last A1c *** - Medications: *** - Compliance: *** - Checking BG at home: *** - Diet: *** - Exercise: *** - Eye exam: *** - Foot exam: *** - Microalbumin: *** - Statin: *** - PNA vaccine: *** - Denies symptoms of hypoglycemia, polyuria, polydipsia, numbness extremities, foot ulcers/trauma  HYPERTENSION / [place] Satisfied with current treatment? {Blank single:19197::"yes","no"} Duration of hypertension: {Blank single:19197::"chronic","months","years"} BP monitoring frequency: {Blank single:19197::"not checking","rarely","daily","weekly","monthly","a few times a day","a few times a week","a few times a month"} BP range:  BP medication side effects: {Blank single:19197::"yes","no"} Past BP meds: {Blank MPNTIRWE:31540::"GQQP","YPPJKDTOIZ","TIWPYKDXIP/JASNKNLZJQ","BHALPFXT","KWIOXBDZHG","DJMEQASTMH/DQQI","WLNLGXQJJH (bystolic)","carvedilol","chlorthalidone","clonidine","diltiazem","exforge HCT","HCTZ","irbesartan (avapro)","labetalol","lisinopril","lisinopril-HCTZ","losartan (cozaar)","methyldopa","nifedipine","olmesartan (benicar)","olmesartan-HCTZ","quinapril","ramipril","spironalactone","tekturna","valsartan","valsartan-HCTZ","verapamil"} Duration of hyperlipidemia: {Blank single:19197::"chronic","months","years"} Cholesterol medication side effects: {Blank single:19197::"yes","no"} Cholesterol supplements: {Blank multiple:19196::"none","fish oil","niacin","red yeast rice"} Past cholesterol medications: {Blank multiple:19196::"none","atorvastain (lipitor)","lovastatin (mevacor)","pravastatin (pravachol)","rosuvastatin  (crestor)","simvastatin (zocor)","vytorin","fenofibrate (tricor)","gemfibrozil","ezetimide (zetia)","niaspan","lovaza"} Medication compliance: {Blank single:19197::"excellent compliance","good compliance","fair compliance","poor compliance"} Aspirin: {Blank single:19197::"yes","no"} Recent stressors: {Blank single:19197::"yes","no"} Recurrent headaches: {Blank single:19197::"yes","no"} Visual changes: {Blank single:19197::"yes","no"} Palpitations: {Blank single:19197::"yes","no"} Dyspnea: {Blank single:19197::"yes","no"} Chest pain: {Blank single:19197::"yes","no"} Lower extremity edema: {Blank single:19197::"yes","no"} Dizzy/lightheaded: {Blank single:19197::"yes","no"}    Alzheimer's Dementia Medications: Mood: Diet: Sleep: Concerns from family?  appears to have social work and Neurology following Reviewed Neuro visit from 06/09/21  MMSE today?    Patient Active Problem List   Diagnosis Date Noted   Shingles 09/12/2021   Hyperlipidemia associated with type 2 diabetes mellitus ([place]) 09/07/2021   Dementia due to Alzheimer's disease 05/28/2021   Carpal tunnel syndrome of right wrist    Type 2 diabetes mellitus with diabetic polyneuropathy (HCC)    Osteoarthritis of cervical spine 11/04/2020   Major depressive disorder 11/04/2020   PTSD (post-traumatic stress disorder) 10/05/2020   Allergic rhinitis 05/17/2018   Obesity (BMI 30.0-34.9) 05/17/2018   Sciatica of right side 07/03/2015    Past Surgical History:  Procedure Laterality Date   ABDOMINAL HYSTERECTOMY      Family History  Problem Relation Age of Onset   Diabetes Mother    Dementia Mother    Prostate cancer Neg Hx    Bladder Cancer Neg Hx    Kidney cancer Neg Hx     Social History   Tobacco Use   Smoking status: Never   Smokeless tobacco: Never  Substance Use Topics   Alcohol use: Not Currently     Current Outpatient Medications:    atorvastatin (LIPITOR) 10 MG tablet, TAKE 1 TABLET BY MOUTH ONCE  EVERY EVENING, Disp: 30 tablet, Rfl: 2   citalopram (CELEXA) 20 MG tablet, Take 1 tablet (20 mg total) by mouth in the morning., Disp: 30 tablet, Rfl: 5   diclofenac Sodium (VOLTAREN) 1 % GEL, Apply 4 g topically 4 (four) times daily., Disp: 100 g, Rfl: 12   donepezil (ARICEPT) 10 MG tablet, Take half tablet (5 mg) daily for 2 weeks, then increase to the full tablet at 10 mg daily, Disp: 30 tablet, Rfl: 11   fexofenadine (ALLEGRA ALLERGY) 180 MG tablet, Take 1 tablet (180 mg total) by mouth daily., Disp: 90 tablet, Rfl: 1   fluticasone (FLONASE) 50 MCG/ACT nasal spray, Place 2 sprays into both nostrils  daily., Disp: 16 g, Rfl: 6   gabapentin (NEURONTIN) 100 MG capsule, Take 1 capsule (100 mg total) by mouth 2 (two) times daily., Disp: 60 capsule, Rfl: 4   ibuprofen (ADVIL) 600 MG tablet, Take 1 tablet (600 mg total) by mouth every 8 (eight) hours as needed., Disp: 20 tablet, Rfl: 0   methocarbamol (ROBAXIN) 500 MG tablet, Take 1 tablet (500 mg total) by mouth every 12 (twelve) hours as needed for muscle spasms., Disp: 30 tablet, Rfl: 0   tamsulosin (FLOMAX) 0.4 MG CAPS capsule, Take 1 capsule (0.4 mg total) by mouth daily., Disp: 7 capsule, Rfl: 0  Allergies  Allergen Reactions   Penicillins     I personally reviewed {Reviewed:14835} with the patient/caregiver today.   ROS    Objective  There were no vitals filed for this visit.  There is no height or weight on file to calculate BMI.  Physical Exam   Recent Results (from the past 2160 hour(s))  Urinalysis, Routine w reflex microscopic     Status: Abnormal   Collection Time: 07/16/21  4:18 PM  Result Value Ref Range   Specific Gravity, UA >1.030 (H) 1.005 - 1.030   pH, UA 5.5 5.0 - 7.5   Color, UA Yellow Yellow   Appearance Ur Cloudy (A) Clear   Leukocytes,UA Trace (A) Negative   Protein,UA Trace (A) Negative/Trace   Glucose, UA Negative Negative   Ketones, UA Trace (A) Negative   RBC, UA 3+ (A) Negative   Bilirubin, UA  Negative Negative   Urobilinogen, Ur 0.2 0.2 - 1.0 mg/dL   Nitrite, UA Negative Negative   Microscopic Examination See below:   Microscopic Examination     Status: Abnormal   Collection Time: 07/16/21  4:18 PM   Urine  Result Value Ref Range   WBC, UA 0-5 0 - 5 /hpf   RBC, Urine 11-30 (A) 0 - 2 /hpf   Epithelial Cells (non renal) 0-10 0 - 10 /hpf   Mucus, UA Present (A) Not Estab.   Bacteria, UA Few (A) None seen/Few  CBC with Differential/Platelet     Status: None   Collection Time: 07/16/21  4:20 PM  Result Value Ref Range   WBC 5.4 3.4 - 10.8 x10E3/uL   RBC 4.82 3.77 - 5.28 x10E6/uL   Hemoglobin 13.5 11.1 - 15.9 g/dL   Hematocrit 40.1 34.0 - 46.6 %   MCV 83 79 - 97 fL   MCH 28.0 26.6 - 33.0 pg   MCHC 33.7 31.5 - 35.7 g/dL   RDW 13.7 11.7 - 15.4 %   Platelets 326 150 - 450 x10E3/uL   Neutrophils 36 Not Estab. %   Lymphs 53 Not Estab. %   Monocytes 8 Not Estab. %   Eos 2 Not Estab. %   Basos 1 Not Estab. %   Neutrophils Absolute 2.0 1.4 - 7.0 x10E3/uL   Lymphocytes Absolute 2.9 0.7 - 3.1 x10E3/uL   Monocytes Absolute 0.4 0.1 - 0.9 x10E3/uL   EOS (ABSOLUTE) 0.1 0.0 - 0.4 x10E3/uL   Basophils Absolute 0.0 0.0 - 0.2 x10E3/uL   Immature Granulocytes 0 Not Estab. %   Immature Grans (Abs) 0.0 0.0 - 0.1 x10E3/uL  Comprehensive metabolic panel     Status: Abnormal   Collection Time: 07/16/21  4:20 PM  Result Value Ref Range   Glucose 136 (H) 70 - 99 mg/dL   BUN 14 8 - 27 mg/dL   Creatinine, Ser 0.97 0.57 - 1.00 mg/dL   eGFR  60 >59 mL/min/1.73   BUN/Creatinine Ratio 14 12 - 28   Sodium 145 (H) 134 - 144 mmol/L   Potassium 4.1 3.5 - 5.2 mmol/L   Chloride 108 (H) 96 - 106 mmol/L   CO2 23 20 - 29 mmol/L   Calcium 10.0 8.7 - 10.3 mg/dL   Total Protein 6.9 6.0 - 8.5 g/dL   Albumin 4.2 3.7 - 4.7 g/dL   Globulin, Total 2.7 1.5 - 4.5 g/dL   Albumin/Globulin Ratio 1.6 1.2 - 2.2   Bilirubin Total 0.2 0.0 - 1.2 mg/dL   Alkaline Phosphatase 124 (H) 44 - 121 IU/L   AST 45 (H) 0 -  40 IU/L   ALT 33 (H) 0 - 32 IU/L  Protime-INR     Status: None   Collection Time: 08/06/21 12:10 PM  Result Value Ref Range   Prothrombin Time 13.0 11.4 - 15.2 seconds   INR 1.0 0.8 - 1.2    Comment: (NOTE) INR goal varies based on device and disease states. Performed at [facility], [address] [place], [place] [address]   APTT     Status: None   Collection Time: 08/06/21 12:10 PM  Result Value Ref Range   aPTT 31 24 - 36 seconds    Comment: Performed at [facility], [address] [place], [place] [address]  CBC     Status: None   Collection Time: 08/06/21 12:10 PM  Result Value Ref Range   WBC 4.1 4.0 - 10.5 K/uL   RBC 5.05 3.87 - 5.11 MIL/uL   Hemoglobin 13.9 12.0 - 15.0 g/dL   HCT 43.7 36.0 - 46.0 %   MCV 86.5 80.0 - 100.0 fL   MCH 27.5 26.0 - 34.0 pg   MCHC 31.8 30.0 - 36.0 g/dL   RDW 14.3 11.5 - 15.5 %   Platelets 297 150 - 400 K/uL   nRBC 0.0 0.0 - 0.2 %    Comment: Performed at [facility], [address] [place], [place] [address]  Differential     Status: Abnormal   Collection Time: 08/06/21 12:10 PM  Result Value Ref Range   Neutrophils Relative % 25 %   Neutro Abs 1.0 (L) 1.7 - 7.7 K/uL   Lymphocytes Relative 63 %   Lymphs Abs 2.6 0.7 - 4.0 K/uL   Monocytes Relative 9 %   Monocytes Absolute 0.4 0.1 - 1.0 K/uL   Eosinophils Relative 2 %   Eosinophils Absolute 0.1 0.0 - 0.5 K/uL   Basophils Relative 1 %   Basophils Absolute 0.0 0.0 - 0.1 K/uL   Immature Granulocytes 0 %   Abs Immature Granulocytes 0.00 0.00 - 0.07 K/uL    Comment: Performed at [facility], [address] [place], [place] [address]  Comprehensive metabolic panel     Status: Abnormal   Collection Time: 08/06/21 12:10 PM  Result Value Ref Range   Sodium 141 135 - 145 mmol/L   Potassium 5.5 (H) 3.5 - 5.1 mmol/L    Comment: HEMOLYSIS AT THIS LEVEL MAY AFFECT RESULT   Chloride 111 98 - 111 mmol/L   CO2 24 22 - 32 mmol/L   Glucose, Bld 95 70  - 99 mg/dL    Comment: Glucose reference range applies only to samples taken after fasting for at least 8 hours.   BUN 12 8 - 23 mg/dL   Creatinine, Ser 0.95 0.44 - 1.00 mg/dL   Calcium 9.6 8.9 - 10.3 mg/dL   Total  Protein 7.6 6.5 - 8.1 g/dL   Albumin 4.0 3.5 - 5.0 g/dL   AST 40 15 - 41 U/L    Comment: HEMOLYSIS AT THIS LEVEL MAY AFFECT RESULT   ALT 27 0 - 44 U/L    Comment: HEMOLYSIS AT THIS LEVEL MAY AFFECT RESULT   Alkaline Phosphatase 66 38 - 126 U/L   Total Bilirubin 1.0 0.3 - 1.2 mg/dL    Comment: HEMOLYSIS AT THIS LEVEL MAY AFFECT RESULT   GFR, Estimated >60 >60 mL/min    Comment: (NOTE) Calculated using the CKD-EPI Creatinine Equation (2021)    Anion gap 6 5 - 15    Comment: Performed at [facility], [address] [place], [place] [address]  Ethanol     Status: None   Collection Time: 08/06/21 12:10 PM  Result Value Ref Range   Alcohol, Ethyl (B) <10 <10 mg/dL    Comment: (NOTE) Lowest detectable limit for serum alcohol is 10 mg/dL.  For medical purposes only. Performed at [facility], [address] [place], [place] [address]   Comprehensive metabolic panel     Status: Abnormal   Collection Time: 08/24/21  8:55 PM  Result Value Ref Range   Sodium 144 135 - 145 mmol/L   Potassium 3.5 3.5 - 5.1 mmol/L   Chloride 109 98 - 111 mmol/L   CO2 26 22 - 32 mmol/L   Glucose, Bld 144 (H) 70 - 99 mg/dL    Comment: Glucose reference range applies only to samples taken after fasting for at least 8 hours.   BUN 12 8 - 23 mg/dL   Creatinine, Ser 1.30 (H) 0.44 - 1.00 mg/dL   Calcium 9.3 8.9 - 10.3 mg/dL   Total Protein 7.3 6.5 - 8.1 g/dL   Albumin 4.2 3.5 - 5.0 g/dL   AST 29 15 - 41 U/L   ALT 25 0 - 44 U/L   Alkaline Phosphatase 79 38 - 126 U/L   Total Bilirubin 0.8 0.3 - 1.2 mg/dL   GFR, Estimated 42 (L) >60 mL/min    Comment: (NOTE) Calculated using the CKD-EPI Creatinine Equation (2021)    Anion gap 9 5 - 15    Comment: Performed at [facility], [address] [place], [place] [address]  CBC     Status: None   Collection Time: 08/24/21  8:55 PM  Result Value Ref Range   WBC 8.6 4.0 - 10.5 K/uL   RBC 4.75 3.87 - 5.11 MIL/uL   Hemoglobin 13.1 12.0 - 15.0 g/dL   HCT 41.8 36.0 - 46.0 %   MCV 88.0 80.0 - 100.0 fL   MCH 27.6 26.0 - 34.0 pg   MCHC 31.3 30.0 - 36.0 g/dL   RDW 14.2 11.5 - 15.5 %   Platelets 281 150 - 400 K/uL   nRBC 0.0 0.0 - 0.2 %    Comment: Performed at [facility], [address] [place], [place] [address]  Urinalysis, Complete w Microscopic Urine, Clean Catch     Status: Abnormal   Collection Time: 09/26/21  8:41 AM  Result Value Ref Range   Color, Urine YELLOW YELLOW   APPearance CLEAR CLEAR   Specific Gravity, Urine 1.025 1.005 - 1.030   pH 5.5 5.0 - 8.0   Glucose, UA NEGATIVE NEGATIVE mg/dL   Hgb urine dipstick TRACE (A) NEGATIVE   Bilirubin Urine SMALL (A) NEGATIVE   Ketones, ur NEGATIVE NEGATIVE mg/dL   Protein, ur TRACE (A) NEGATIVE mg/dL  Nitrite NEGATIVE NEGATIVE   Leukocytes,Ua SMALL (A) NEGATIVE   Squamous Epithelial / LPF 6-10 0 - 5   WBC, UA 11-20 0 - 5 WBC/hpf   RBC / HPF 6-10 0 - 5 RBC/hpf   Bacteria, UA FEW (A) NONE SEEN   Budding Yeast PRESENT    Hyaline Casts, UA PRESENT     Comment: Performed at [facility], [address] [place], [place] [address]  Urine Culture     Status: Abnormal   Collection Time: 09/26/21  8:41 AM   Specimen: Urine, Clean Catch  Result Value Ref Range   Specimen Description      URINE, CLEAN CATCH Performed at [facility], [address], [place], [place] [address]    Special Requests      NONE Performed at [facility], [address], [place], [place] [address]    Culture MULTIPLE SPECIES PRESENT, SUGGEST RECOLLECTION (A)    Report Status 09/27/2021 FINAL      PHQ2/9:    10/30/2020    7:20 PM 10/30/2020    7:19 PM 08/05/2020    9:12 AM  Depression screen PHQ 2/9  Decreased  Interest 0 0 3  Down, Depressed, Hopeless 0 0 3  PHQ - 2 Score 0 0 6  Altered sleeping 1  0  Tired, decreased energy 0  0  Change in appetite 0  0  Feeling bad or failure about yourself  0  0  Trouble concentrating 0  0  Moving slowly or fidgety/restless 0  0  Suicidal thoughts 0  0  PHQ-9 Score 1  6  Difficult doing work/chores Not difficult at all  Somewhat difficult      Fall Risk:    06/09/2021    1:20 PM 11/21/2020    9:45 AM 10/30/2020    7:18 PM 08/16/2020    9:28 AM 08/05/2020    9:12 AM  Fall Risk   Falls in the past year? 0 0 0 1 0  Number falls in past yr: 0 0 0 1 1  Injury with Fall? 0 0 0 0 1  Risk for fall due to :  No Fall Risks History of fall(s)  No Fall Risks  Follow up  Falls evaluation completed Falls evaluation completed        Functional Status Survey:      Assessment & Plan

## 2021-10-06 ENCOUNTER — Other Ambulatory Visit: Payer: Self-pay | Admitting: Family Medicine

## 2021-10-07 NOTE — Telephone Encounter (Signed)
Requested medication (s) are due for refill today - no  Requested medication (s) are on the active medication list -no  Future visit scheduled -no  Last refill: 09/05/21  Notes to clinic: Acute Rx- no longer on medication list  Requested Prescriptions  Pending Prescriptions Disp Refills   valACYclovir (VALTREX) 1000 MG tablet [Pharmacy Med Name: VALACYCLOVIR HCL 1 GM TAB] 20 tablet 0    Sig: TAKE 1 TABLET BY MOUTH TWICE DAILY FOR 10 DAYS     Antimicrobials:  Antiviral Agents - Anti-Herpetic Passed - 10/06/2021 10:25 AM      Passed - Valid encounter within last 12 months    Recent Outpatient Visits           3 weeks ago Herpes zoster without complication   [facility] [place], Buckhorn T, NP   1 month ago Herpes zoster without complication   [facility], Megan P, DO   2 months ago Chronic left-sided low back pain, unspecified whether sciatica present   [name], [name], MD   7 months ago Sinus pressure   [name], [name], MD   10 months ago Ear pain, bilateral   [name], [name], MD                 Requested Prescriptions  Pending Prescriptions Disp Refills   valACYclovir (VALTREX) 1000 MG tablet [Pharmacy Med Name: VALACYCLOVIR HCL 1 GM TAB] 20 tablet 0    Sig: TAKE 1 TABLET BY MOUTH TWICE DAILY FOR 10 DAYS     Antimicrobials:  Antiviral Agents - Anti-Herpetic Passed - 10/06/2021 10:25 AM      Passed - Valid encounter within last 12 months    Recent Outpatient Visits           3 weeks ago Herpes zoster without complication   [facility] [place], [place] T, NP   1 month ago Herpes zoster without complication   [facility] [place], Megan P, DO   2 months ago Chronic left-sided low back pain, unspecified whether sciatica present   [name], [name], MD   7 months ago Sinus pressure   [name], [name], MD    10 months ago Ear pain, bilateral   [name], Roma Schanz, MD

## 2021-10-17 ENCOUNTER — Telehealth: Payer: Self-pay

## 2021-10-17 NOTE — Telephone Encounter (Signed)
Copied from CRM [id number]. Topic: General - Other >> Oct 17, 2021 11:24 AM Lyman Speller wrote: Reason for CRM: Pts daughter called to have a nurse go over Xray results to see if kidney stone is still showing up/ please advise

## 2021-10-24 ENCOUNTER — Ambulatory Visit: Payer: Self-pay

## 2021-10-24 NOTE — Telephone Encounter (Signed)
   Chief Complaint: Daughter reports worsening dementia.States she "is doing things like getting in the dumpster and emptying the trash and trying to clean it." Requests appointment next week. Symptoms: Above Frequency: Several weeks Pertinent Negatives: Patient denies  Disposition: [] ED /[] Urgent Care (no appt availability in office) / [x] Appointment(In office/virtual)/ []  [facility]/ [] Home Care/ [] Refused Recommended Disposition /[] [facility]/ []  Follow-up with PCP Additional Notes: Go to ED for worsening of symptoms.  Reason for Disposition  [1] Worsening confusion AND [2] gradual onset (days to weeks)  Answer Assessment - Initial Assessment Questions 1. MAIN CONCERN OR SYMPTOM:  "What is your main concern right now?" "What questions do you have?" "What's the main symptom you're worried about?" (e.g., confusion, memory loss)     Strange behavior 2. ONSET:  "When did the symptom start (or worsen)?" (minutes, hours, days, weeks)     Weeks 3. BETTER-SAME-WORSE: "Are you (the patient) getting better, staying the same, or getting worse compared to the day you (they) were diagnosed or most recent hospital discharge ?"     Worse 4. DIAGNOSIS: "Was the dementia diagnosed by a doctor?" If Yes, ask: "When?" (e.g., days, months, years ago)     Yes 5. MEDICINES: "Has there been any change in medicines recently?" (e.g., narcotics, antihistamines, benzodiazepines, etc.)     No 6. OTHER SYMPTOMS: "Are there any other symptoms?" (e.g., fever, cough, pain, falling)     No 7. SUPPORT: Document living circumstances and support (e.g., family, nursing home)     Family  Protocols used: Dementia Symptoms and Questions-A-AH

## 2021-10-28 ENCOUNTER — Telehealth: Payer: Self-pay | Admitting: Internal Medicine

## 2021-10-28 DIAGNOSIS — F4310 Post-traumatic stress disorder, unspecified: Secondary | ICD-10-CM

## 2021-10-28 DIAGNOSIS — F0280 Dementia in other diseases classified elsewhere without behavioral disturbance: Secondary | ICD-10-CM

## 2021-10-28 DIAGNOSIS — F330 Major depressive disorder, recurrent, mild: Secondary | ICD-10-CM

## 2021-10-28 NOTE — Telephone Encounter (Signed)
Copied from CRM [id number]. Topic: General - Inquiry >> Oct 28, 2021 11:07 AM De Blanch wrote: Reason for CRM: Pt daughter stated that she would like a referral for a Child psychotherapist. Pt daughter mentioned she used to speak with Bridgett Larsson, LCSW but has not heard from her in a while, unsure if she is still the patient's Child psychotherapist. [name] stated that if Leavy Cella is still her Child psychotherapist, she would like a call from her as soon as possible, as it is very important.  [name] declined to provide details or speak with NT.

## 2021-10-30 ENCOUNTER — Ambulatory Visit (INDEPENDENT_AMBULATORY_CARE_PROVIDER_SITE_OTHER): Payer: Medicare PPO | Admitting: *Deleted

## 2021-10-30 ENCOUNTER — Encounter: Payer: Self-pay | Admitting: Physician Assistant

## 2021-10-30 ENCOUNTER — Ambulatory Visit (INDEPENDENT_AMBULATORY_CARE_PROVIDER_SITE_OTHER): Payer: Medicare PPO | Admitting: Physician Assistant

## 2021-10-30 VITALS — BP 126/80 | HR 88 | Temp 97.8°F | Ht 62.99 in | Wt 175.6 lb

## 2021-10-30 DIAGNOSIS — Z0000 Encounter for general adult medical examination without abnormal findings: Secondary | ICD-10-CM

## 2021-10-30 DIAGNOSIS — G8929 Other chronic pain: Secondary | ICD-10-CM | POA: Diagnosis not present

## 2021-10-30 DIAGNOSIS — E1169 Type 2 diabetes mellitus with other specified complication: Secondary | ICD-10-CM | POA: Diagnosis not present

## 2021-10-30 DIAGNOSIS — M25512 Pain in left shoulder: Secondary | ICD-10-CM

## 2021-10-30 DIAGNOSIS — M545 Low back pain, unspecified: Secondary | ICD-10-CM

## 2021-10-30 DIAGNOSIS — E785 Hyperlipidemia, unspecified: Secondary | ICD-10-CM

## 2021-10-30 MED ORDER — ATORVASTATIN CALCIUM 10 MG PO TABS: ORAL_TABLET | ORAL | 2 refills | Status: DC

## 2021-10-30 NOTE — Patient Instructions (Signed)
Based on your symptoms and physical exam I believe the following is the cause of your concern today Back pain likely secondary to a strain of your back muscles  I recommend the following at this time to help relieve that discomfort:  Rest Warm compresses to the area (20 minutes on, minimum of 30 minutes off) You can alternate Tylenol and Ibuprofen for pain management but Ibuprofen is typically preferred to reduce inflammation.  Tylenol is preferred due to your kidney function Gentle stretches and exercises that I have included in your paperwork Try to reduce excess strain to the area and rest as much as possible  Wear supportive shoes and, if you must lift anything, use proper lifting techniques that spare your back.   If these measures do not lead to improvement in your symptoms over the next 2-4 weeks please let us know    
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
## 2021-10-30 NOTE — Patient Instructions (Signed)
Erin Patrick , Thank you for taking time to come for your Medicare Wellness Visit. I appreciate your ongoing commitment to your health goals. Please review the following plan we discussed and let me know if I can assist you in the future.   Screening recommendations/referrals: Colonoscopy: no longer required Mammogram: Education provided Bone Density: Education provided Recommended yearly ophthalmology/optometry visit for glaucoma screening and checkup Recommended yearly dental visit for hygiene and checkup  Vaccinations: Influenza vaccine: Education provided Pneumococcal vaccine: up to date Tdap vaccine: Education provided Shingles vaccine: Education provided    Advanced directives: on file      Preventive Care 65 Years and Older, Female Preventive care refers to lifestyle choices and visits with your health care provider that can promote health and wellness. What does preventive care include? A yearly physical exam. This is also called an annual well check. Dental exams once or twice a year. Routine eye exams. Ask your health care provider how often you should have your eyes checked. Personal lifestyle choices, including: Daily care of your teeth and gums. Regular physical activity. Eating a healthy diet. Avoiding tobacco and drug use. Limiting alcohol use. Practicing safe sex. Taking low-dose aspirin every day. Taking vitamin and mineral supplements as recommended by your health care provider. What happens during an annual well check? The services and screenings done by your health care provider during your annual well check will depend on your age, overall health, lifestyle risk factors, and family history of disease. Counseling  Your health care provider may ask you questions about your: Alcohol use. Tobacco use. Drug use. Emotional well-being. Home and relationship well-being. Sexual activity. Eating habits. History of falls. Memory and ability to understand  (cognition). Work and work Astronomer. Reproductive health. Screening  You may have the following tests or measurements: Height, weight, and BMI. Blood pressure. Lipid and cholesterol levels. These may be checked every 5 years, or more frequently if you are over 49 years old. Skin check. Lung cancer screening. You may have this screening every year starting at age 27 if you have a 30-pack-year history of smoking and currently smoke or have quit within the past 15 years. Fecal occult blood test (FOBT) of the stool. You may have this test every year starting at age 25. Flexible sigmoidoscopy or colonoscopy. You may have a sigmoidoscopy every 5 years or a colonoscopy every 10 years starting at age 74. Hepatitis C blood test. Hepatitis B blood test. Sexually transmitted disease (STD) testing. Diabetes screening. This is done by checking your blood sugar (glucose) after you have not eaten for a while (fasting). You may have this done every 1-3 years. Bone density scan. This is done to screen for osteoporosis. You may have this done starting at age 24. Mammogram. This may be done every 1-2 years. Talk to your health care provider about how often you should have regular mammograms. Talk with your health care provider about your test results, treatment options, and if necessary, the need for more tests. Vaccines  Your health care provider may recommend certain vaccines, such as: Influenza vaccine. This is recommended every year. Tetanus, diphtheria, and acellular pertussis (Tdap, Td) vaccine. You may need a Td booster every 10 years. Zoster vaccine. You may need this after age 106. Pneumococcal 13-valent conjugate (PCV13) vaccine. One dose is recommended after age 101. Pneumococcal polysaccharide (PPSV23) vaccine. One dose is recommended after age 40. Talk to your health care provider about which screenings and vaccines you need and how often you need  them. This information is not intended to  replace advice given to you by your health care provider. Make sure you discuss any questions you have with your health care provider. Document Released: 03/01/2015 Document Revised: 10/23/2015 Document Reviewed: 12/04/2014 Elsevier Interactive Patient Education  2017 [place] Prevention in the Home Falls can cause injuries. They can happen to people of all ages. There are many things you can do to make your home safe and to help prevent falls. What can I do on the outside of my home? Regularly fix the edges of walkways and driveways and fix any cracks. Remove anything that might make you trip as you walk through a door, such as a raised step or threshold. Trim any bushes or trees on the path to your home. Use bright outdoor lighting. Clear any walking paths of anything that might make someone trip, such as rocks or tools. Regularly check to see if handrails are loose or broken. Make sure that both sides of any steps have handrails. Any raised decks and porches should have guardrails on the edges. Have any leaves, snow, or ice cleared regularly. Use sand or salt on walking paths during winter. Clean up any spills in your garage right away. This includes oil or grease spills. What can I do in the bathroom? Use night lights. Install grab bars by the toilet and in the tub and shower. Do not use towel bars as grab bars. Use non-skid mats or decals in the tub or shower. If you need to sit down in the shower, use a plastic, non-slip stool. Keep the floor dry. Clean up any water that spills on the floor as soon as it happens. Remove soap buildup in the tub or shower regularly. Attach bath mats securely with double-sided non-slip rug tape. Do not have throw rugs and other things on the floor that can make you trip. What can I do in the bedroom? Use night lights. Make sure that you have a light by your bed that is easy to reach. Do not use any sheets or blankets that are too big for  your bed. They should not hang down onto the floor. Have a firm chair that has side arms. You can use this for support while you get dressed. Do not have throw rugs and other things on the floor that can make you trip. What can I do in the kitchen? Clean up any spills right away. Avoid walking on wet floors. Keep items that you use a lot in easy-to-reach places. If you need to reach something above you, use a strong step stool that has a grab bar. Keep electrical cords out of the way. Do not use floor polish or wax that makes floors slippery. If you must use wax, use non-skid floor wax. Do not have throw rugs and other things on the floor that can make you trip. What can I do with my stairs? Do not leave any items on the stairs. Make sure that there are handrails on both sides of the stairs and use them. Fix handrails that are broken or loose. Make sure that handrails are as long as the stairways. Check any carpeting to make sure that it is firmly attached to the stairs. Fix any carpet that is loose or worn. Avoid having throw rugs at the top or bottom of the stairs. If you do have throw rugs, attach them to the floor with carpet tape. Make sure that you have a light switch at  the top of the stairs and the bottom of the stairs. If you do not have them, ask someone to add them for you. What else can I do to help prevent falls? Wear shoes that: Do not have high heels. Have rubber bottoms. Are comfortable and fit you well. Are closed at the toe. Do not wear sandals. If you use a stepladder: Make sure that it is fully opened. Do not climb a closed stepladder. Make sure that both sides of the stepladder are locked into place. Ask someone to hold it for you, if possible. Clearly mark and make sure that you can see: Any grab bars or handrails. First and last steps. Where the edge of each step is. Use tools that help you move around (mobility aids) if they are needed. These  include: Canes. Walkers. Scooters. Crutches. Turn on the lights when you go into a dark area. Replace any light bulbs as soon as they burn out. Set up your furniture so you have a clear path. Avoid moving your furniture around. If any of your floors are uneven, fix them. If there are any pets around you, be aware of where they are. Review your medicines with your doctor. Some medicines can make you feel dizzy. This can increase your chance of falling. Ask your doctor what other things that you can do to help prevent falls. This information is not intended to replace advice given to you by your health care provider. Make sure you discuss any questions you have with your health care provider. Document Released: 11/29/2008 Document Revised: 07/11/2015 Document Reviewed: 03/09/2014 Elsevier Interactive Patient Education  2017 [employer].
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
## 2021-10-31 DIAGNOSIS — M545 Low back pain, unspecified: Secondary | ICD-10-CM | POA: Insufficient documentation

## 2021-10-31 NOTE — Assessment & Plan Note (Signed)
Chronic per patient and daughter Erin Patrick of full chronicity or cause due to dementia She has reduced ROM which may be consistent with adhesive capsulitis or rotator cuff injury She is amenable to Ortho referral for evaluation and potential management- discussed these conditions with her and her daughter so they can complete conservative therapies while awaiting specialty apt Follow up as needed and collaboration with Ortho as indicated

## 2021-10-31 NOTE — Assessment & Plan Note (Signed)
Chronic, historic condition Refills of Atorvastatin provided today Recommend follow up in 6 months to recheck Lipid panel and monitoring

## 2021-10-31 NOTE — Assessment & Plan Note (Signed)
Chronic per patient- appears to have had previous surgery at pain sites so this could be recurrent She reports pain following sciatic nerve distribution on right side and reports bilateral lower back pain Unsure of impact on daily function due to dementia dx but will recommend evaluation with Ortho for this While awaiting Ortho apt I recommend gentle stretches, massage to the areas in pain, warm compresses and Tylenol to assist with pain Follow up as needed and will collaborate with Ortho as indicated.

## 2021-11-04 ENCOUNTER — Telehealth: Payer: Self-pay | Admitting: Licensed Clinical Social Worker

## 2021-11-04 NOTE — Patient Outreach (Signed)
  Care Coordination   11/04/2021 Name: Erin Patrick MRN: [id number] DOB: 24-Nov-1945   Care Coordination Outreach Attempts:  An unsuccessful telephone outreach was attempted today. LCSW left message requesting a return call.  Follow Up Plan:  LCSW will wait for a return call. Will make additional attempts within the week  Encounter Outcome:  No Answer  Care Coordination Interventions Activated:  No   Care Coordination Interventions:  No, not indicated    Christa See, MSW, [email or web address] Phone [phone] 1:18 PM

## 2021-11-04 NOTE — Addendum Note (Signed)
Addended by: Marnee Guarneri T on: 11/04/2021 02:06 PM   Modules accepted: Orders

## 2021-11-11 ENCOUNTER — Telehealth: Payer: Self-pay

## 2021-11-11 NOTE — Chronic Care Management (AMB) (Signed)
  Care Coordination   Note   11/11/2021 Name: Erin Patrick MRN: [id number] DOB: 1945/03/24  Erin Patrick is a 76 y.o. year old female who sees [name], [name], MD (Inactive) for primary care. I reached out to [facility] by phone today to offer care coordination services.  Erin Patrick was given information about Care Coordination services today including:   The Care Coordination services include support from the care team which includes your Nurse Coordinator, Clinical Social Worker, or Pharmacist.  The Care Coordination team is here to help remove barriers to the health concerns and goals most important to you. Care Coordination services are voluntary, and the patient may decline or stop services at any time by request to their care team member.   Care Coordination Consent Status: Patient agreed to services and verbal consent obtained.   Follow up plan:  Telephone appointment with care coordination team member scheduled for:  11/17/2021  Encounter Outcome:  Pt. Scheduled  Erin Patrick, [place], [place] [address] Direct Dial: [phone] [email or web address]

## 2021-11-14 NOTE — Patient Outreach (Signed)
  Care Coordination   Follow Up Visit Note   11/14/2021 Name: Erin Patrick MRN: [id number] DOB: 07/28/1945  Erin Patrick is a 76 y.o. year old female who sees [name], [name], MD (Inactive) for primary care. I spoke with  [facility] Pav[facility]'s daughter, Erin Patrick, by phone today.  What matters to the patients health and wellness today?  Management of MDD symptoms and Caregiver Fatigue    Goals Addressed             This Visit's Progress    Management of MDD symptoms       Care Coordination Interventions: Solution-Focused Strategies employed:  Active listening / Reflection utilized  Emotional Support Provided Caregiver stress acknowledged  Verbalization of feelings encouraged  Discussed caregiver resources and support: [employer] Support [employer] LCSW returned pt's daughter's, Erin Patrick, call. Caregiver reports concern with increase in pt's mood swings and withdrawn behavior Healthy coping skills identified. Caregiver has made attempts to encourage pt to leave residence (senior centers, shopping, etc) and/or engage in stimulating activities in the home (puzzles) Pt often refuses to engage  LCSW strongly encouraged counseling (in-person or virtual) to assist pt in management of mental health conditions. Caregiver was provided contact information on [place] to initiate services        SDOH assessments and interventions completed:  No     Care Coordination Interventions Activated:  Yes  Care Coordination Interventions:  Yes, provided   Follow up plan: No further intervention required.   Encounter Outcome:  Pt. Visit Completed   Christa See, MSW, [email or web address] Phone [phone] 12:26 AM

## 2021-11-14 NOTE — Patient Instructions (Signed)
Visit Information  Thank you for taking time to visit with me today. Please don't hesitate to contact me if I can be of assistance to you.   Following are the goals we discussed today:   Goals Addressed             This Visit's Progress    COMPLETED: Management of MDD symptoms       Care Coordination Interventions: Solution-Focused Strategies employed:  Active listening / Reflection utilized  Emotional Support Provided Caregiver stress acknowledged  Verbalization of feelings encouraged  Discussed caregiver resources and support: Lawyer Support Groups LCSW returned pt's daughter's, Santo Held, call. Caregiver reports concern with increase in pt's mood swings and withdrawn behavior Healthy coping skills identified. Caregiver has made attempts to encourage pt to leave residence (senior centers, shopping, etc) and/or engage in stimulating activities in the home (puzzles) Pt often refuses to engage  LCSW strongly encouraged counseling (in-person or virtual) to assist pt in management of mental health conditions. Caregiver was provided contact information on [place] to initiate services        If you are experiencing a [place] or [place] or need someone to talk to, please call the Suicide and Crisis Lifeline: 988 call 911   Patient verbalizes understanding of instructions and care plan provided today and agrees to view in [place]. Active MyChart status and patient understanding of how to access instructions and care plan via MyChart confirmed with patient.     No further follow up required:    Christa See, MSW, [email or web address] Phone [phone] 12:27 AM

## 2021-11-17 ENCOUNTER — Ambulatory Visit: Payer: Self-pay | Admitting: *Deleted

## 2021-11-18 NOTE — Patient Instructions (Signed)
Visit Information  Thank you for taking time to visit with me today. Please don't hesitate to contact me if I can be of assistance to you.   Following are the goals we discussed today:   Goals Addressed             This Visit's Progress    Data processing manager Needs       Care Coordination Interventions: [employer] Needs Assessment completed Patient's daughter concerned about patient's socialization Options Designer, multimedia as well as [facility] discussed with patient and daughter Patient agreeable to [facility]-contact information provided to schedule a tour Solution-Focused Strategies employed:  Active listening / Reflection utilized  Behavioral Activation reviewed         Our next appointment is by telephone on 12/01/21 at 2pm  Please call the care guide team at [phone] if you need to cancel or reschedule your appointment.   If you are experiencing a Mental Health or [place] or need someone to talk to, please call the Suicide and Crisis Lifeline: 988 call 911   Patient verbalizes understanding of instructions and care plan provided today and agrees to view in [place]. Active MyChart status and patient understanding of how to access instructions and care plan via MyChart confirmed with patient.     Telephone follow up appointment with care management team member scheduled for: 12/01/21  Elliot Gurney, [place] Worker  [facility] Care Management [phone]

## 2021-11-18 NOTE — Patient Outreach (Signed)
  Care Coordination   Initial Visit Note   11/18/2021 Name: Erin Patrick MRN: [id number] DOB: 1945-07-04  Evangaline Jou is a 76 y.o. year old female who sees [name], [name], MD (Inactive) for primary care. I spoke with  [employer] by phone today.  What matters to the patients health and wellness today?  Community Resource Needs    Goals Addressed             This Visit's Buyer, retail Needs       Care Coordination Interventions: Community Resource Needs Assessment completed Patient's daughter concerned about patient's socialization Options Designer, multimedia as well as [facility] discussed with patient and daughter Patient agreeable to [facility]-contact information provided to schedule a tour Solution-Focused Strategies employed:  Active listening / Reflection utilized  Veterinary surgeon reviewed         SDOH assessments and interventions completed:  Yes  SDOH Interventions Today    Flowsheet Row Most Recent Value  SDOH Interventions   Food Insecurity Interventions Intervention Not Indicated  Housing Interventions Intervention Not Indicated  Transportation Interventions Intervention Not Indicated  Utilities Interventions Intervention Not Indicated  Physical Activity Interventions Intervention Not Indicated        Care Coordination Interventions Activated:  Yes  Care Coordination Interventions:  Yes, provided   Follow up plan: Follow up call scheduled for 12/01/21    Encounter Outcome:  Pt. Visit Completed

## 2021-12-01 ENCOUNTER — Ambulatory Visit: Payer: Self-pay | Admitting: *Deleted

## 2021-12-01 ENCOUNTER — Telehealth: Payer: Self-pay | Admitting: Licensed Clinical Social Worker

## 2021-12-01 NOTE — Patient Instructions (Signed)
Visit Information  Thank you for taking time to visit with me today. Please don't hesitate to contact me if I can be of assistance to you.   Following are the goals we discussed today:   Goals Addressed             This Visit's Progress    Community Resource Needs       Care Coordination Interventions: Follow up phone call to patient's daughter concerned about patient's socialization Options discussed-[facility] as well as [facility] discussed with patient and daughter Patient agreeable to [facility]program contacted, however the cost would be $51.00 per per day-1x per week discussed as a possibility-patient's daughter agreeable and will schedule a tour-1x per week to be considered Collaboration  phone call to previous social worker Christa See, LCSW to discuss additional Adult Day Care options Patient's daughter encouraged to call this Education officer, museum with any additional community resource needs        If you are experiencing a Mental Health or [place] or need someone to talk to, please call the Suicide and Crisis Lifeline: 988 call 911   Patient verbalizes understanding of instructions and care plan provided today and agrees to view in [place]. Active MyChart status and patient understanding of how to access instructions and care plan via MyChart confirmed with patient.     No further follow up required: patient's daughter to contact this Education officer, museum with any additional community resource needs  [employer], [place] Worker  [facility] Care Management [phone]

## 2021-12-01 NOTE — Patient Outreach (Signed)
  Care Coordination   Collaboration  Visit Note   12/01/2021 Name: Erin Patrick MRN: [id number] DOB: 03/21/1945  Erin Patrick is a 76 y.o. year old female who sees [name], [name], MD (Inactive) for primary care. I spoke with LCSW Land  What matters to the patients health and wellness today?  Pt was not engaged during encounter   LCSW provided previous hx to newly assigned LCSW Land. Suggestions regarding care coordination plan of care discussed   SDOH assessments and interventions completed:  No     Care Coordination Interventions Activated:  Yes  Care Coordination Interventions:  Yes, provided   Follow up plan: LCSW Land will continue to provide supportive services through care coordination   Encounter Outcome:  Pt. Visit Completed   Christa See, MSW, [email or web address] Phone [phone] 5:22 PM

## 2021-12-17 ENCOUNTER — Ambulatory Visit: Payer: Medicare PPO | Admitting: Physician Assistant

## 2021-12-19 ENCOUNTER — Ambulatory Visit (INDEPENDENT_AMBULATORY_CARE_PROVIDER_SITE_OTHER): Payer: Medicare PPO | Admitting: Family Medicine

## 2021-12-19 ENCOUNTER — Other Ambulatory Visit
Admission: RE | Admit: 2021-12-19 | Discharge: 2021-12-19 | Disposition: A | Discharge status: Home / Self Care | Payer: Medicare PPO | Source: Ambulatory Visit | Attending: Family Medicine | Admitting: Family Medicine

## 2021-12-19 ENCOUNTER — Encounter: Payer: Self-pay | Admitting: Family Medicine

## 2021-12-19 VITALS — BP 117/75 | HR 76 | Temp 98.0°F | Wt 176.1 lb

## 2021-12-19 DIAGNOSIS — E785 Hyperlipidemia, unspecified: Secondary | ICD-10-CM | POA: Insufficient documentation

## 2021-12-19 DIAGNOSIS — S41102A Unspecified open wound of left upper arm, initial encounter: Secondary | ICD-10-CM | POA: Diagnosis not present

## 2021-12-19 DIAGNOSIS — E1169 Type 2 diabetes mellitus with other specified complication: Secondary | ICD-10-CM | POA: Insufficient documentation

## 2021-12-19 DIAGNOSIS — E1142 Type 2 diabetes mellitus with diabetic polyneuropathy: Secondary | ICD-10-CM | POA: Diagnosis present

## 2021-12-19 DIAGNOSIS — F4310 Post-traumatic stress disorder, unspecified: Secondary | ICD-10-CM | POA: Diagnosis not present

## 2021-12-19 DIAGNOSIS — Z0000 Encounter for general adult medical examination without abnormal findings: Secondary | ICD-10-CM

## 2021-12-19 DIAGNOSIS — F330 Major depressive disorder, recurrent, mild: Secondary | ICD-10-CM | POA: Diagnosis not present

## 2021-12-19 DIAGNOSIS — G309 Alzheimer's disease, unspecified: Secondary | ICD-10-CM

## 2021-12-19 DIAGNOSIS — Z23 Encounter for immunization: Secondary | ICD-10-CM

## 2021-12-19 DIAGNOSIS — Z13820 Encounter for screening for osteoporosis: Secondary | ICD-10-CM

## 2021-12-19 DIAGNOSIS — G8929 Other chronic pain: Secondary | ICD-10-CM

## 2021-12-19 DIAGNOSIS — F0280 Dementia in other diseases classified elsewhere without behavioral disturbance: Secondary | ICD-10-CM | POA: Insufficient documentation

## 2021-12-19 DIAGNOSIS — M545 Low back pain, unspecified: Secondary | ICD-10-CM

## 2021-12-19 LAB — MICROSCOPIC EXAMINATION: Bacteria, UA: NONE SEEN

## 2021-12-19 LAB — CBC WITH DIFFERENTIAL/PLATELET
Abs Immature Granulocytes: 0.01 10*3/uL (ref 0.00–0.07)
Basophils Absolute: 0 10*3/uL (ref 0.0–0.1)
Basophils Relative: 1 %
Eosinophils Absolute: 0.1 10*3/uL (ref 0.0–0.5)
Eosinophils Relative: 1 %
HCT: 47.5 % — ABNORMAL HIGH (ref 36.0–46.0)
Hemoglobin: 15.3 g/dL — ABNORMAL HIGH (ref 12.0–15.0)
Immature Granulocytes: 0 %
Lymphocytes Relative: 50 %
Lymphs Abs: 2.1 10*3/uL (ref 0.7–4.0)
MCH: 28.4 pg (ref 26.0–34.0)
MCHC: 32.2 g/dL (ref 30.0–36.0)
MCV: 88.3 fL (ref 80.0–100.0)
Monocytes Absolute: 0.2 10*3/uL (ref 0.1–1.0)
Monocytes Relative: 5 %
Neutro Abs: 1.8 10*3/uL (ref 1.7–7.7)
Neutrophils Relative %: 43 %
Platelets: 285 10*3/uL (ref 150–400)
RBC: 5.38 MIL/uL — ABNORMAL HIGH (ref 3.87–5.11)
RDW: 14.2 % (ref 11.5–15.5)
WBC: 4.3 10*3/uL (ref 4.0–10.5)
nRBC: 0 % (ref 0.0–0.2)

## 2021-12-19 LAB — LIPID PANEL
Cholesterol: 264 mg/dL — ABNORMAL HIGH (ref 0–200)
HDL: 76 mg/dL (ref >40)
LDL Cholesterol: 175 mg/dL — ABNORMAL HIGH (ref 0–99)
Total CHOL/HDL Ratio: 3.5 RATIO
Triglycerides: 63 mg/dL (ref <150)
VLDL: 13 mg/dL (ref 0–40)

## 2021-12-19 LAB — HEMOGLOBIN A1C
Hgb A1c MFr Bld: 5.7 % — ABNORMAL HIGH (ref 4.8–5.6)
Mean Plasma Glucose: 116.89 mg/dL

## 2021-12-19 LAB — URINALYSIS, ROUTINE W REFLEX MICROSCOPIC
Bilirubin, UA: NEGATIVE
Glucose, UA: NEGATIVE
Nitrite, UA: NEGATIVE
Protein,UA: NEGATIVE
Specific Gravity, UA: 1.02 (ref 1.005–1.030)
Urobilinogen, Ur: 0.2 mg/dL (ref 0.2–1.0)
pH, UA: 6.5 (ref 5.0–7.5)

## 2021-12-19 LAB — TSH: TSH: 1.613 u[IU]/mL (ref 0.350–4.500)

## 2021-12-19 LAB — COMPREHENSIVE METABOLIC PANEL
ALT: 22 U/L (ref 0–44)
AST: 29 U/L (ref 15–41)
Albumin: 4.1 g/dL (ref 3.5–5.0)
Alkaline Phosphatase: 90 U/L (ref 38–126)
Anion gap: 8 (ref 5–15)
BUN: 12 mg/dL (ref 8–23)
CO2: 25 mmol/L (ref 22–32)
Calcium: 9.5 mg/dL (ref 8.9–10.3)
Chloride: 112 mmol/L — ABNORMAL HIGH (ref 98–111)
Creatinine, Ser: 1.04 mg/dL — ABNORMAL HIGH (ref 0.44–1.00)
GFR, Estimated: 55 mL/min — ABNORMAL LOW (ref >60)
Glucose, Bld: 80 mg/dL (ref 70–99)
Potassium: 4.1 mmol/L (ref 3.5–5.1)
Sodium: 145 mmol/L (ref 135–145)
Total Bilirubin: 0.6 mg/dL (ref 0.3–1.2)
Total Protein: 7.2 g/dL (ref 6.5–8.1)

## 2021-12-19 LAB — MICROALBUMIN, URINE WAIVED
Creatinine, Urine Waived: 200 mg/dL (ref 10–300)
Microalb, Ur Waived: 10 mg/L (ref 0–19)
Microalb/Creat Ratio: <30 mg/g (ref <30)

## 2021-12-19 MED ORDER — ARIPIPRAZOLE 2 MG PO TABS: 2.0000 mg | ORAL_TABLET | Freq: Every day | ORAL | 0 refills | Status: DC

## 2021-12-19 MED ORDER — DICLOFENAC SODIUM 1 % EX GEL: 4.0000 g | Freq: Four times a day (QID) | CUTANEOUS | 12 refills | Status: DC

## 2021-12-19 MED ORDER — DONEPEZIL HCL 10 MG PO TABS: ORAL_TABLET | ORAL | 0 refills | Status: DC

## 2021-12-19 MED ORDER — CITALOPRAM HYDROBROMIDE 20 MG PO TABS: 20.0000 mg | ORAL_TABLET | Freq: Every morning | ORAL | 0 refills | Status: DC

## 2021-12-19 MED ORDER — FEXOFENADINE HCL 180 MG PO TABS: 180.0000 mg | ORAL_TABLET | Freq: Every day | ORAL | 0 refills | Status: DC

## 2021-12-19 MED ORDER — GABAPENTIN 100 MG PO CAPS: 100.0000 mg | ORAL_CAPSULE | Freq: Two times a day (BID) | ORAL | 0 refills | Status: DC

## 2021-12-19 MED ORDER — FLUTICASONE PROPIONATE 50 MCG/ACT NA SUSP
2.0000 | Freq: Every day | NASAL | 6 refills | Status: DC
Start: 2021-12-19 — End: 2022-04-23

## 2021-12-19 MED ORDER — ATORVASTATIN CALCIUM 10 MG PO TABS: ORAL_TABLET | ORAL | 0 refills | Status: DC

## 2021-12-19 NOTE — Assessment & Plan Note (Signed)
Has been off all her medication. Will restart and recheck in 1 month.  
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
## 2021-12-19 NOTE — Patient Instructions (Signed)
Bone Density Appointment 12/31/21 1:40PM  Imaging at [facility] [address] [place],  [place]  [id number] Phone: [phone]

## 2021-12-19 NOTE — Assessment & Plan Note (Signed)
Living with her daughter. Stable. Has been off all her medication. Will restart and add low dose abilify to help with paranoia. Recheck 1 month. Call with any concerns.

## 2021-12-19 NOTE — Assessment & Plan Note (Signed)
Has been off all her medication. Will restart and recheck in 1 month.

## 2021-12-19 NOTE — Assessment & Plan Note (Signed)
Has been off all her medication. Will restart and add low dose abilify to help with paranoia. Recheck 1 month. Call with any concerns

## 2021-12-19 NOTE — Assessment & Plan Note (Signed)
Has been off all her medication. Will restart and add low dose abilify to help with paranoia. Recheck 1 month. Call with any concerns 
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
## 2021-12-31 ENCOUNTER — Other Ambulatory Visit: Payer: Medicare PPO

## 2022-01-01 ENCOUNTER — Ambulatory Visit (INDEPENDENT_AMBULATORY_CARE_PROVIDER_SITE_OTHER): Payer: Medicare PPO | Admitting: Psychiatry

## 2022-01-01 ENCOUNTER — Encounter: Payer: Self-pay | Admitting: Psychiatry

## 2022-01-01 VITALS — BP 132/81 | HR 94 | Temp 97.8°F | Ht 62.0 in | Wt 180.6 lb

## 2022-01-01 DIAGNOSIS — F330 Major depressive disorder, recurrent, mild: Secondary | ICD-10-CM

## 2022-01-01 DIAGNOSIS — F0390 Unspecified dementia without behavioral disturbance: Secondary | ICD-10-CM | POA: Diagnosis not present

## 2022-01-01 MED ORDER — SERTRALINE HCL 25 MG PO TABS: 25.0000 mg | ORAL_TABLET | Freq: Every day | ORAL | 1 refills | Status: DC

## 2022-01-01 NOTE — Patient Instructions (Signed)
Discontinue citalopram, Abilify Start sertraline 25 mg daily  Next appointment: 1/18 at 10:30

## 2022-01-30 ENCOUNTER — Ambulatory Visit: Payer: Medicare PPO | Admitting: Family Medicine
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
## 2022-03-05 ENCOUNTER — Ambulatory Visit: Payer: Medicare PPO | Admitting: Psychiatry

## 2022-04-18 ENCOUNTER — Emergency Department: Payer: Medicare PPO

## 2022-04-18 ENCOUNTER — Emergency Department
Admission: EM | Admit: 2022-04-18 | Discharge: 2022-04-18 | Disposition: A | Discharge status: Home / Self Care | Payer: Medicare PPO | Attending: Emergency Medicine | Admitting: Emergency Medicine

## 2022-04-18 ENCOUNTER — Other Ambulatory Visit: Payer: Self-pay

## 2022-04-18 ENCOUNTER — Encounter: Payer: Self-pay | Admitting: Emergency Medicine

## 2022-04-18 DIAGNOSIS — E119 Type 2 diabetes mellitus without complications: Secondary | ICD-10-CM | POA: Insufficient documentation

## 2022-04-18 DIAGNOSIS — R3129 Other microscopic hematuria: Secondary | ICD-10-CM | POA: Diagnosis not present

## 2022-04-18 DIAGNOSIS — R1030 Lower abdominal pain, unspecified: Secondary | ICD-10-CM | POA: Diagnosis present

## 2022-04-18 DIAGNOSIS — F0390 Unspecified dementia without behavioral disturbance: Secondary | ICD-10-CM | POA: Insufficient documentation

## 2022-04-18 DIAGNOSIS — R109 Unspecified abdominal pain: Secondary | ICD-10-CM

## 2022-04-18 LAB — LIPASE, BLOOD: Lipase: 47 U/L (ref 11–51)

## 2022-04-18 LAB — COMPREHENSIVE METABOLIC PANEL
ALT: 24 U/L (ref 0–44)
AST: 30 U/L (ref 15–41)
Albumin: 4.2 g/dL (ref 3.5–5.0)
Alkaline Phosphatase: 87 U/L (ref 38–126)
Anion gap: 10 (ref 5–15)
BUN: 14 mg/dL (ref 8–23)
CO2: 25 mmol/L (ref 22–32)
Calcium: 9.4 mg/dL (ref 8.9–10.3)
Chloride: 105 mmol/L (ref 98–111)
Creatinine, Ser: 0.94 mg/dL (ref 0.44–1.00)
GFR, Estimated: >60 mL/min (ref >60)
Glucose, Bld: 131 mg/dL — ABNORMAL HIGH (ref 70–99)
Potassium: 4 mmol/L (ref 3.5–5.1)
Sodium: 140 mmol/L (ref 135–145)
Total Bilirubin: 0.7 mg/dL (ref 0.3–1.2)
Total Protein: 7.5 g/dL (ref 6.5–8.1)

## 2022-04-18 LAB — CK: Total CK: 290 U/L — ABNORMAL HIGH (ref 38–234)

## 2022-04-18 LAB — CBC
HCT: 40.8 % (ref 36.0–46.0)
Hemoglobin: 13 g/dL (ref 12.0–15.0)
MCH: 27.9 pg (ref 26.0–34.0)
MCHC: 31.9 g/dL (ref 30.0–36.0)
MCV: 87.6 fL (ref 80.0–100.0)
Platelets: 298 10*3/uL (ref 150–400)
RBC: 4.66 MIL/uL (ref 3.87–5.11)
RDW: 13.4 % (ref 11.5–15.5)
WBC: 6.7 10*3/uL (ref 4.0–10.5)
nRBC: 0 % (ref 0.0–0.2)

## 2022-04-18 LAB — URINALYSIS, ROUTINE W REFLEX MICROSCOPIC
Bilirubin Urine: NEGATIVE
Glucose, UA: NEGATIVE mg/dL
Ketones, ur: NEGATIVE mg/dL
Leukocytes,Ua: NEGATIVE
Nitrite: NEGATIVE
Protein, ur: 100 mg/dL — AB
RBC / HPF: >50 RBC/hpf (ref 0–5)
Specific Gravity, Urine: 1.023 (ref 1.005–1.030)
Squamous Epithelial / HPF: NONE SEEN /HPF (ref 0–5)
WBC, UA: NONE SEEN WBC/hpf (ref 0–5)
pH: 5 (ref 5.0–8.0)

## 2022-04-18 MED ORDER — IOHEXOL 300 MG/ML  SOLN
100.0000 mL | Freq: Once | INTRAMUSCULAR | Status: AC | PRN
  Administered 2022-04-18: 100 mL via INTRAVENOUS

## 2022-04-18 NOTE — ED Notes (Signed)
Introduced myself to patient. Patient is laying in bed. Daughter is at the bedside. Patient denies pain at this time.

## 2022-04-18 NOTE — ED Provider Notes (Signed)
[facility] Provider Note    Event Date/Time   First MD Initiated Contact with Patient 04/18/22 2053     (approximate)   History   Chief Complaint: Abdominal Pain   HPI  Erin Patrick is a 77 y.o. female with a history of dementia, diabetes who comes to the ED due to abdominal pain earlier today.  Family member at bedside notes that the patient had a sudden onset of lower abdominal pain with dysuria earlier today.  No hematuria, no vaginal bleeding, no fall, no trauma.  No fever chills chest pain shortness of breath or vomiting.  She does have a history of kidney stones.  Currently pain-free and feels back to baseline.     Physical Exam   Triage Vital Signs: ED Triage Vitals  Enc Vitals Group     BP 04/18/22 2048 (!) 144/88     Pulse Rate 04/18/22 2048 90     Resp 04/18/22 2048 20     Temp 04/18/22 2048 98 F (36.7 C)     Temp Source 04/18/22 2048 Oral     SpO2 04/18/22 2048 100 %     Weight 04/18/22 2047 180 lb 12.4 oz (82 kg)     Height 04/18/22 2047 '5\' 2"'$  (1.575 m)     Head Circumference --      Peak Flow --      Pain Score 04/18/22 2047 0     Pain Loc --      Pain Edu? --      Excl. in [place]? --     Most recent vital signs: Vitals:   04/18/22 2200 04/18/22 2255  BP: (!) 141/57 116/74  Pulse: 88 80  Resp: 20 19  Temp:    SpO2: 100% 100%    General: Awake, no distress.  CV:  Good peripheral perfusion.  Regular rate Resp:  Normal effort.  Clear to auscultation bilaterally Abd:  No distention.  Soft nontender Other:  Moist oral mucosa   ED Results / Procedures / Treatments   Labs (all labs ordered are listed, but only abnormal results are displayed) Labs Reviewed  COMPREHENSIVE METABOLIC PANEL - Abnormal; Notable for the following components:      Result Value   Glucose, Bld 131 (*)    All other components within normal limits  URINALYSIS, ROUTINE W REFLEX MICROSCOPIC - Abnormal; Notable for the following components:   Color,  Urine YELLOW (*)    APPearance CLOUDY (*)    Hgb urine dipstick LARGE (*)    Protein, ur 100 (*)    Bacteria, UA RARE (*)    Non Squamous Epithelial PRESENT (*)    All other components within normal limits  CK - Abnormal; Notable for the following components:   Total CK 290 (*)    All other components within normal limits  LIPASE, BLOOD  CBC     EKG Interpreted by me Sinus rhythm rate of 70.  Normal axis and intervals.  Poor R wave progression.  Normal ST segments and T waves.   RADIOLOGY CT abdomen pelvis interpreted by me, negative for ureteral obstruction.  Radiology report reviewed, unremarkable   PROCEDURES:  Procedures   MEDICATIONS ORDERED IN ED: Medications  iohexol (OMNIPAQUE) 300 MG/ML solution 100 mL (100 mLs Intravenous Contrast Given 04/18/22 2240)     IMPRESSION / MDM / ASSESSMENT AND PLAN / ED COURSE  I reviewed the triage vital signs and the nursing notes.  DDx: Cystitis, ureterolithiasis, AKI, diverticulitis  Patient's presentation is most consistent with acute presentation with potential threat to life or bodily function.  Patient presents with episode of severe abdominal pain which has since resolved.  Vital signs are normal, exam is benign and reassuring.  Serum labs are normal.  Urinalysis shows some microscopic hematuria but no signs of infection.  CT scan negative for ureteral obstruction or other acute findings.  Patient remains calm, comfortable, at baseline and stable for discharge home.       FINAL CLINICAL IMPRESSION(S) / ED DIAGNOSES   Final diagnoses:  Abdominal pain, unspecified abdominal location  Microscopic hematuria     Rx / DC Orders   ED Discharge Orders     None        Note:  This document was prepared using Dragon voice recognition software and may include unintentional dictation errors.   Carrie Mew, MD 04/18/22 [phone]

## 2022-04-18 NOTE — ED Notes (Signed)
To Ct 

## 2022-04-18 NOTE — ED Notes (Signed)
Back from CT

## 2022-04-18 NOTE — Discharge Instructions (Signed)
Your lab tests and CT scan of the abdomen were all okay today. Please follow up with your doctor for recheck of your urine test in a week to make sure the blood in the urine has resolved.

## 2022-04-18 NOTE — ED Triage Notes (Signed)
Pt states she does not know why she is here. Pt alert, ambulatory, and in NAD on arrival to triage room. Pt with dark, tea colored urine when able to provide urine sample.   Pt c/o generalized body aches x several months, however denies any pain at this time.

## 2022-04-18 NOTE — ED Triage Notes (Signed)
First RN Note: Pt to ED via [facility] from home with c/o sudden onset lower abd pain. Per EMS no dysuria. Per EMS pt has hx of dementia. Per EMS pt has had no complaints en route, EMS reports pt is supposed to be taking anxiety and depression meds however has not been taking any of her meds due to recent move locally and not being established with local MD. Per EMS pt with soft and non-tender abd, no localized pain with palpation.    108/81 100% RA 91HR CBG 143 98.5

## 2022-04-21 ENCOUNTER — Telehealth: Payer: Self-pay

## 2022-04-21 NOTE — Telephone Encounter (Signed)
-----   Message from Georgina Peer, [place] sent at 04/21/2022  8:53 AM EST ----- Needs TOC call completed please.

## 2022-04-21 NOTE — Transitions of Care (Post Inpatient/ED Visit) (Signed)
   04/21/2022  Name: Erin Patrick MRN: [id number] DOB: 1945/06/27  Today's TOC FU Call Status: Today's TOC FU Call Status:: Successful TOC FU Call Competed TOC FU Call Complete Date: 04/21/22  Transition Care Management Follow-up Telephone Call Date of Discharge: 04/18/22 Discharge Facility: [facility]) Type of Discharge: Emergency Department How have you been since you were released from the hospital?: Same Any questions or concerns?: No  Items Reviewed: Did you receive and understand the discharge instructions provided?: Yes Medications obtained and verified?: Yes (Medications Reviewed) Any new allergies since your discharge?: No Dietary orders reviewed?: NA Do you have support at home?: Yes People in Home: child(ren), adult  Home Care and Equipment/Supplies: [place] Ordered?: NA Any new equipment or medical supplies ordered?: NA  Functional Questionnaire: Do you need assistance with bathing/showering or dressing?: Yes Do you need assistance with meal preparation?: Yes Do you need assistance with eating?: Yes Do you have difficulty maintaining continence: Yes Do you need assistance with getting out of bed/getting out of a chair/moving?: Yes Do you have difficulty managing or taking your medications?: Yes  Folllow up appointments reviewed: PCP Follow-up appointment confirmed?: Yes MD Provider Line Number:[phone] Given: Yes [facility] Follow-up appointment confirmed?: NA Do you need transportation to your follow-up appointment?: Yes Do you understand care options if your condition(s) worsen?: Yes-patient verbalized understanding    [place], [place]

## 2022-04-22 NOTE — Progress Notes (Signed)
Acute Office Visit   Patient: Erin Patrick   DOB: Jun 10, 1945   77 y.o. Female  MRN: [id number] Visit Date: 04/23/2022  Today's healthcare provider: Dani Gobble Dyron Kawano, PA-C  Introduced myself to the patient as a Journalist, newspaper and provided education on APPs in clinical practice.    Chief Complaint  Patient presents with   Hospitalization Follow-up   Abdominal Pain    Patient says she is feeling a little better as far as her abdominal pain, says she is no experiencing the pain as she was when she went to ER.    Urinary Frequency   Urinary Urgency   Subjective    HPI HPI     Abdominal Pain    Additional comments: Patient says she is feeling a little better as far as her abdominal pain, says she is no experiencing the pain as she was when she went to ER.       Last edited by Irena Reichmann, [place] on 04/23/2022  9:10 AM.        Patient was seen in the ED on 04/18/22  for abdominal pain and dysuria   CT scan was notable for the following:   IMPRESSION: 1. No acute intra-abdominal or pelvic pathology. 2. A 3 mm nonobstructing right renal inferior pole calculus. No hydronephrosis. 3. A 9 mm bladder stone as seen on the prior CT. 4. Fatty liver. 5. Aortic Atherosclerosis (ICD10-I70.0).  Reviewed lab results along with imaging- UA was positive for RBCs and rare bacteria  Patient was discharged after stabilizing in ED   Today's Follow up  She reports she is still having intermittent pain in her upper abdomen and headaches  She indicates pain is along the left and right upper quadrant and wraps along the sides  She denies current heartburn symptoms She reports dysuria, increased frequency, urgency, suprapubic pain,  She denies flank pain, fever or chills  She reports a productive cough for the past 2 weeks  She denies fever, chills, wheezing,     Medications: Outpatient Medications Prior to Visit  Medication Sig   ARIPiprazole (ABILIFY) 2 MG tablet Take 1 tablet (2 mg total)  by mouth daily.   atorvastatin (LIPITOR) 10 MG tablet TAKE 1 TABLET BY MOUTH ONCE EVERY EVENING   diclofenac Sodium (VOLTAREN) 1 % GEL Apply 4 g topically 4 (four) times daily.   donepezil (ARICEPT) 10 MG tablet Take half tablet (5 mg) daily for 2 weeks, then increase to the full tablet at 10 mg daily   gabapentin (NEURONTIN) 100 MG capsule Take 1 capsule (100 mg total) by mouth 2 (two) times daily.   methocarbamol (ROBAXIN) 500 MG tablet Take 1 tablet (500 mg total) by mouth every 12 (twelve) hours as needed for muscle spasms.   tamsulosin (FLOMAX) 0.4 MG CAPS capsule Take 1 capsule (0.4 mg total) by mouth daily.   [DISCONTINUED] fexofenadine (ALLEGRA ALLERGY) 180 MG tablet Take 1 tablet (180 mg total) by mouth daily.   [DISCONTINUED] fluticasone (FLONASE) 50 MCG/ACT nasal spray Place 2 sprays into both nostrils daily.   sertraline (ZOLOFT) 25 MG tablet Take 1 tablet (25 mg total) by mouth at bedtime.   No facility-administered medications prior to visit.    Review of Systems  Constitutional:  Negative for chills, diaphoresis, fatigue and fever.  HENT:  Positive for congestion. Negative for ear pain.   Eyes:  Positive for discharge. Negative for itching.  Respiratory:  Positive for cough. Negative for chest  tightness, shortness of breath and wheezing.   Gastrointestinal:  Positive for abdominal pain.  Genitourinary:  Positive for dysuria, frequency, urgency and vaginal pain. Negative for difficulty urinating, flank pain, hematuria, vaginal bleeding and vaginal discharge.  Neurological:  Positive for headaches.       Objective    BP 117/70   Pulse (!) 112   Temp 99.9 F (37.7 C) (Oral)   Ht 5\' 2"  (1.575 m)   Wt 178 lb 9.6 oz (81 kg)   LMP  (LMP Unknown)   SpO2 97%   BMI 32.67 kg/m    Physical Exam Vitals reviewed.  Constitutional:      General: She is awake.     Appearance: Normal appearance. She is well-developed and well-groomed.  HENT:     Head: Normocephalic and  atraumatic.  Cardiovascular:     Rate and Rhythm: Normal rate and regular rhythm.     Heart sounds: Normal heart sounds.  Pulmonary:     Effort: Pulmonary effort is normal.     Breath sounds: Normal breath sounds. No decreased air movement. No decreased breath sounds, wheezing, rhonchi or rales.  Abdominal:     General: Abdomen is flat. Bowel sounds are normal.     Palpations: Abdomen is soft.     Tenderness: There is abdominal tenderness. There is right CVA tenderness. There is no left CVA tenderness, guarding or rebound.  Musculoskeletal:     Cervical back: Normal range of motion.  Neurological:     Mental Status: She is alert.  Psychiatric:        Behavior: Behavior is cooperative.       Results for orders placed or performed in visit on 04/23/22  WET PREP FOR [place], YEAST, CLUE   Specimen: Sterile Swab   Sterile Swab  Result Value Ref Range   Trichomonas Exam Negative Negative   Yeast Exam Negative Negative   Clue Cell Exam Positive (A) Negative  Microscopic Examination   Urine  Result Value Ref Range   WBC, UA 0-5 0 - 5 /hpf   RBC, Urine >30R 0 - 2 /hpf   Epithelial Cells (non renal) 0-10 0 - 10 /hpf   Mucus, UA Present (A) Not Estab.   Bacteria, UA None seen None seen/Few  Urinalysis, Routine w reflex microscopic  Result Value Ref Range   Specific Gravity, UA >1.030 (H) 1.005 - 1.030   pH, UA 5.0 5.0 - 7.5   Color, UA Yellow Yellow   Appearance Ur Cloudy (A) Clear   Leukocytes,UA Negative Negative   Protein,UA 2+ (A) Negative/Trace   Glucose, UA Negative Negative   Ketones, UA Trace (A) Negative   RBC, UA 3+ (A) Negative   Bilirubin, UA CANCELED    Urobilinogen, Ur 1.0 0.2 - 1.0 mg/dL   Nitrite, UA Negative Negative   Microscopic Examination See below:     Assessment & Plan      No follow-ups on file.       Problem List Items Addressed This Visit       Respiratory   Allergic rhinitis    Chronic, ongoing Recommend she switch Allegra to Zyrtec  and resume Flonase to assist with symptoms Rx sent in per request - daughter instructed to stop allegra and replace with Zyrtec Follow up as needed       Relevant Medications   fluticasone (FLONASE) 50 MCG/ACT nasal spray   cetirizine (ZYRTEC) 10 MG tablet   Other Visit Diagnoses     BV (bacterial  vaginosis)    -  Primary Acute, ongoing  Patient reports abdominal pain, dysuria that have not completely resolved despite going to ED  UA was concerning for ketones, mucus, and protein  Wet prep was positive for clue cells so will start Flagyl 500 mg PO BID x 7 days  Suspect this may be the underlying cause for her dysuria but recommend follow up as needed for persistent or progressing symptoms  Will still send urine culture for more definitive rule out of UTI     Relevant Medications   metroNIDAZOLE (FLAGYL) 500 MG tablet   Urinary urgency       Relevant Orders   Urinalysis, Routine w reflex microscopic (Completed)   Urine Culture   Dysuria       Relevant Medications   phenazopyridine (PYRIDIUM) 100 MG tablet   Other Relevant Orders   WET PREP FOR [place], YEAST, CLUE (Completed)        No follow-ups on file.   I, Tonishia Steffy E Shaneka Efaw, PA-C, have reviewed all documentation for this visit. The documentation on 04/24/22 for the exam, diagnosis, procedures, and orders are all accurate and complete.   Talitha Givens, MHS, PA-C [facility]

## 2022-04-23 ENCOUNTER — Ambulatory Visit: Payer: Medicare PPO | Admitting: Physician Assistant

## 2022-04-23 ENCOUNTER — Encounter: Payer: Self-pay | Admitting: Physician Assistant

## 2022-04-23 VITALS — BP 117/70 | HR 112 | Temp 99.9°F | Ht 62.0 in | Wt 178.6 lb

## 2022-04-23 DIAGNOSIS — N760 Acute vaginitis: Secondary | ICD-10-CM

## 2022-04-23 DIAGNOSIS — R3915 Urgency of urination: Secondary | ICD-10-CM | POA: Diagnosis not present

## 2022-04-23 DIAGNOSIS — B9689 Other specified bacterial agents as the cause of diseases classified elsewhere: Secondary | ICD-10-CM

## 2022-04-23 DIAGNOSIS — J309 Allergic rhinitis, unspecified: Secondary | ICD-10-CM

## 2022-04-23 DIAGNOSIS — R300 Dysuria: Secondary | ICD-10-CM

## 2022-04-23 LAB — URINALYSIS, ROUTINE W REFLEX MICROSCOPIC
Glucose, UA: NEGATIVE
Leukocytes,UA: NEGATIVE
Nitrite, UA: NEGATIVE
Specific Gravity, UA: >1.03 — ABNORMAL HIGH (ref 1.005–1.030)
Urobilinogen, Ur: 1 mg/dL (ref 0.2–1.0)
pH, UA: 5 (ref 5.0–7.5)

## 2022-04-23 LAB — WET PREP FOR TRICH, YEAST, CLUE
Clue Cell Exam: POSITIVE — AB
Trichomonas Exam: NEGATIVE
Yeast Exam: NEGATIVE

## 2022-04-23 LAB — MICROSCOPIC EXAMINATION: Bacteria, UA: NONE SEEN

## 2022-04-23 MED ORDER — PHENAZOPYRIDINE HCL 100 MG PO TABS: 100.0000 mg | ORAL_TABLET | Freq: Three times a day (TID) | ORAL | 0 refills | Status: DC | PRN

## 2022-04-23 MED ORDER — FLUTICASONE PROPIONATE 50 MCG/ACT NA SUSP: 2.0000 | Freq: Every day | NASAL | 6 refills | Status: DC

## 2022-04-23 MED ORDER — CETIRIZINE HCL 10 MG PO TABS: 10.0000 mg | ORAL_TABLET | Freq: Every day | ORAL | 4 refills | Status: DC

## 2022-04-23 MED ORDER — METRONIDAZOLE 500 MG PO TABS: 500.0000 mg | ORAL_TABLET | Freq: Two times a day (BID) | ORAL | 0 refills | Status: DC

## 2022-04-23 NOTE — Progress Notes (Signed)
Your cervicovaginal swab was positive for bacterial vaginosis. This may be the cause of your urinary discomfort. I have sent in a script for Flagyl to help treat this. Please take by mouth twice per day for 7 days to ensure adequate treatment.

## 2022-04-24 NOTE — Assessment & Plan Note (Signed)
Chronic, ongoing Recommend she switch Allegra to Zyrtec and resume Flonase to assist with symptoms Rx sent in per request - daughter instructed to stop allegra and replace with Zyrtec Follow up as needed

## 2022-04-25 LAB — URINE CULTURE: Organism ID, Bacteria: NO GROWTH

## 2022-04-27 NOTE — Progress Notes (Signed)
Your urine culture was negative for bacterial growth so it is unlikely you have a UTI. I suspect your discomfort was caused by the bacterial vaginosis. Please complete the flagyl that was sent in for this.

## 2022-04-30 ENCOUNTER — Ambulatory Visit: Payer: Medicare PPO | Admitting: Physician Assistant

## 2022-05-16 IMAGING — DX DG SHOULDER 2+V*L*
3 series · 3 of 3 positions shown · non-contrast
Comparison: None.

CLINICAL DATA: Left shoulder pain.

EXAM:
LEFT SHOULDER - 2+ VIEW

[shoulder ap]
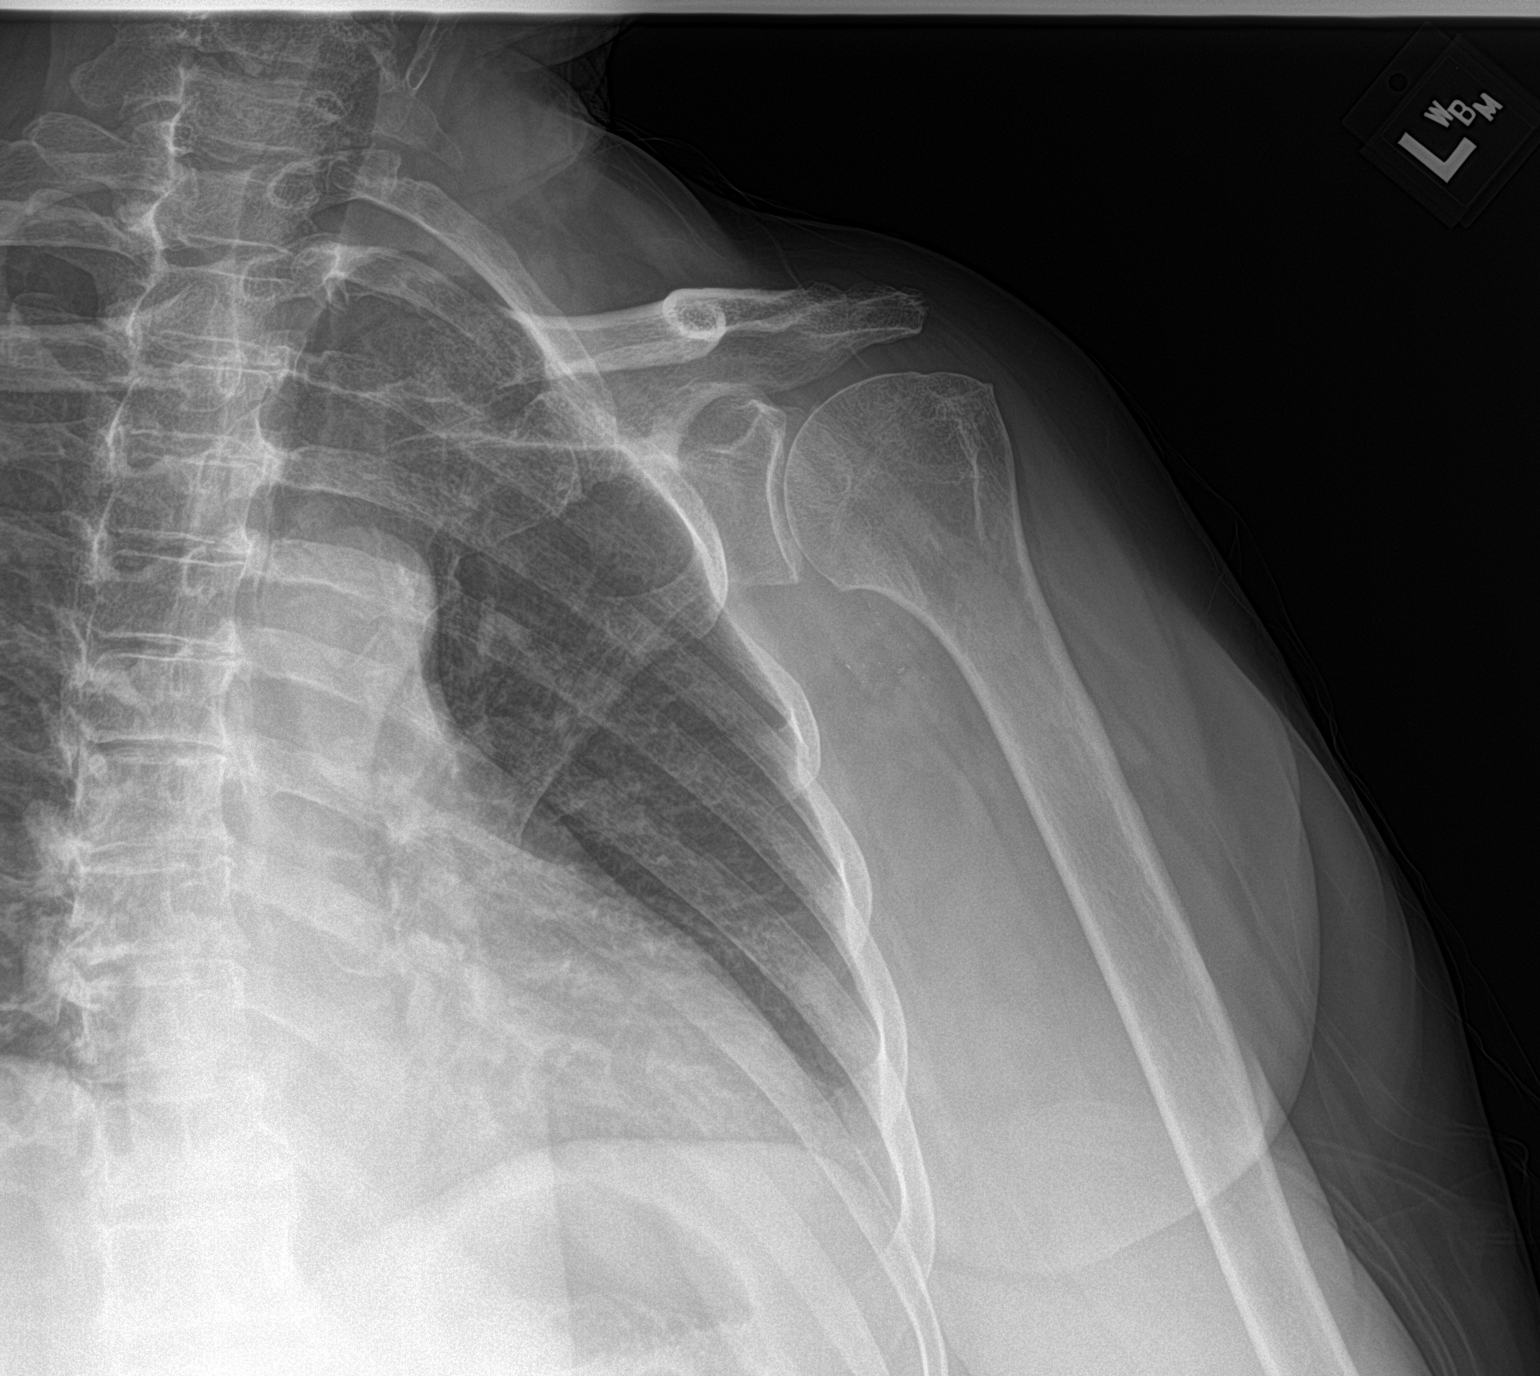

[shoulder y-view]
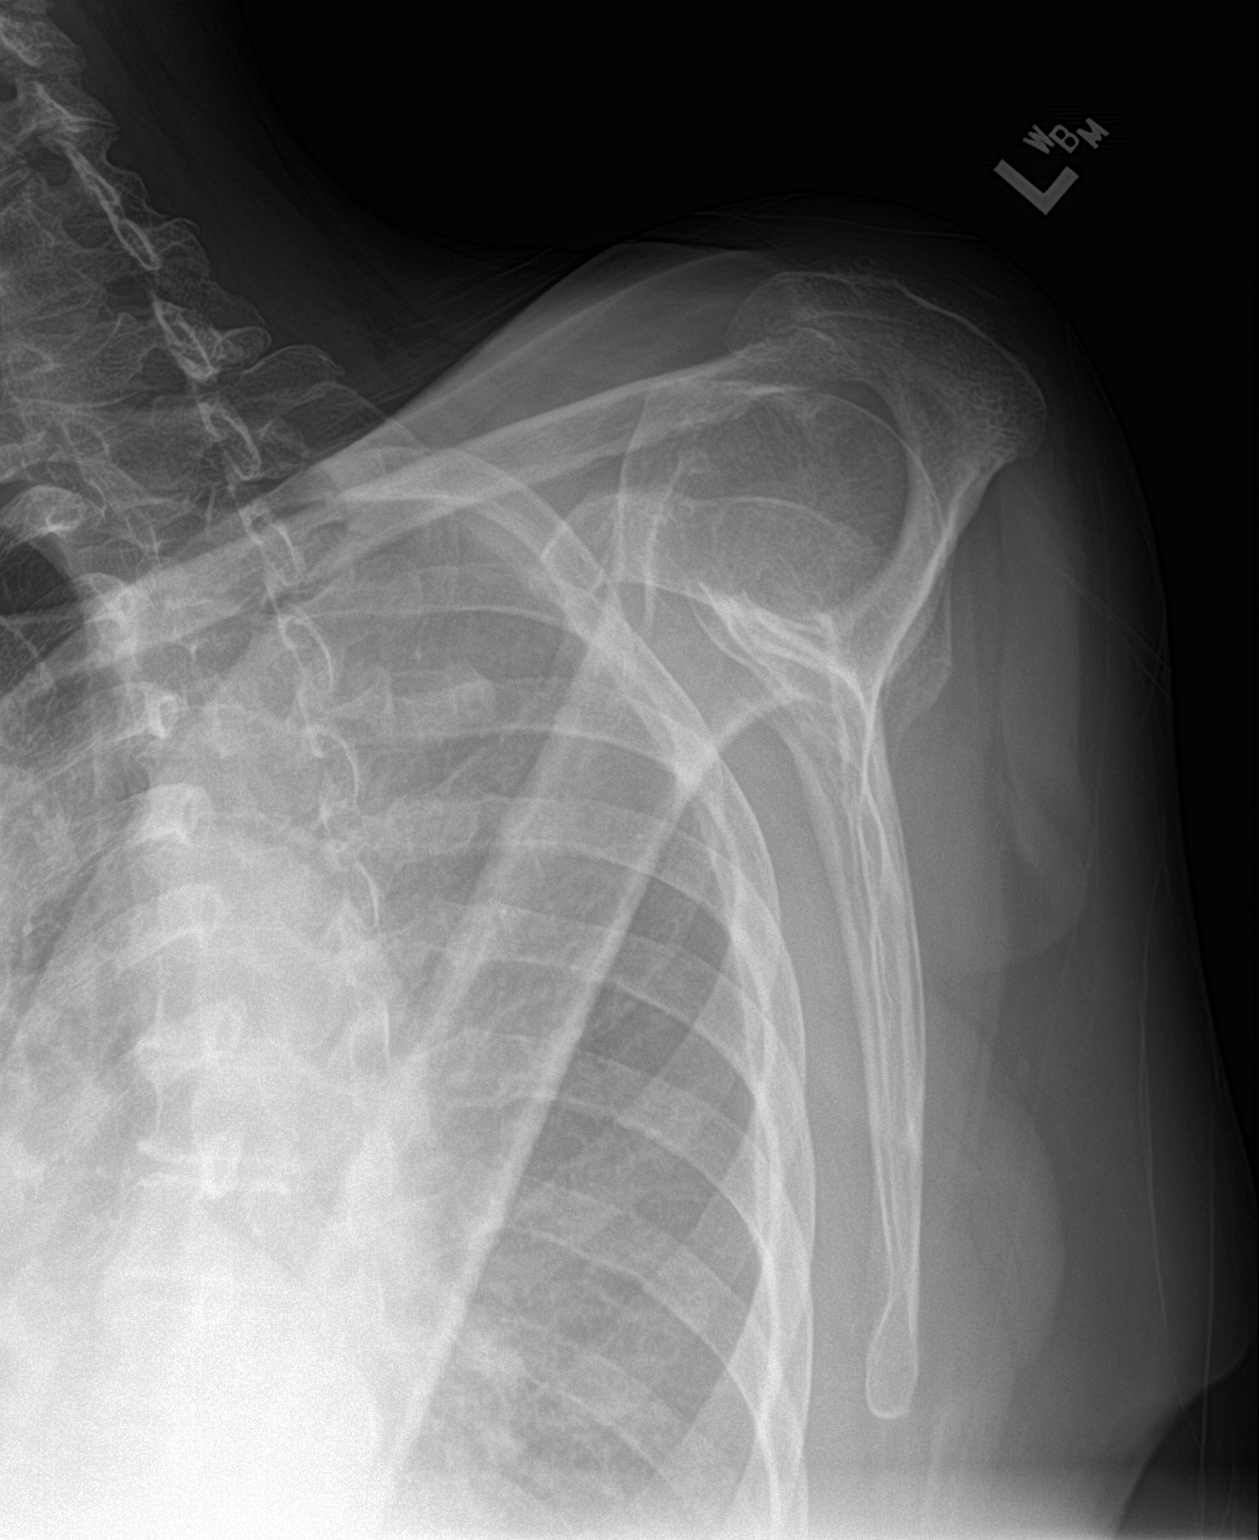

[shoulder axial]
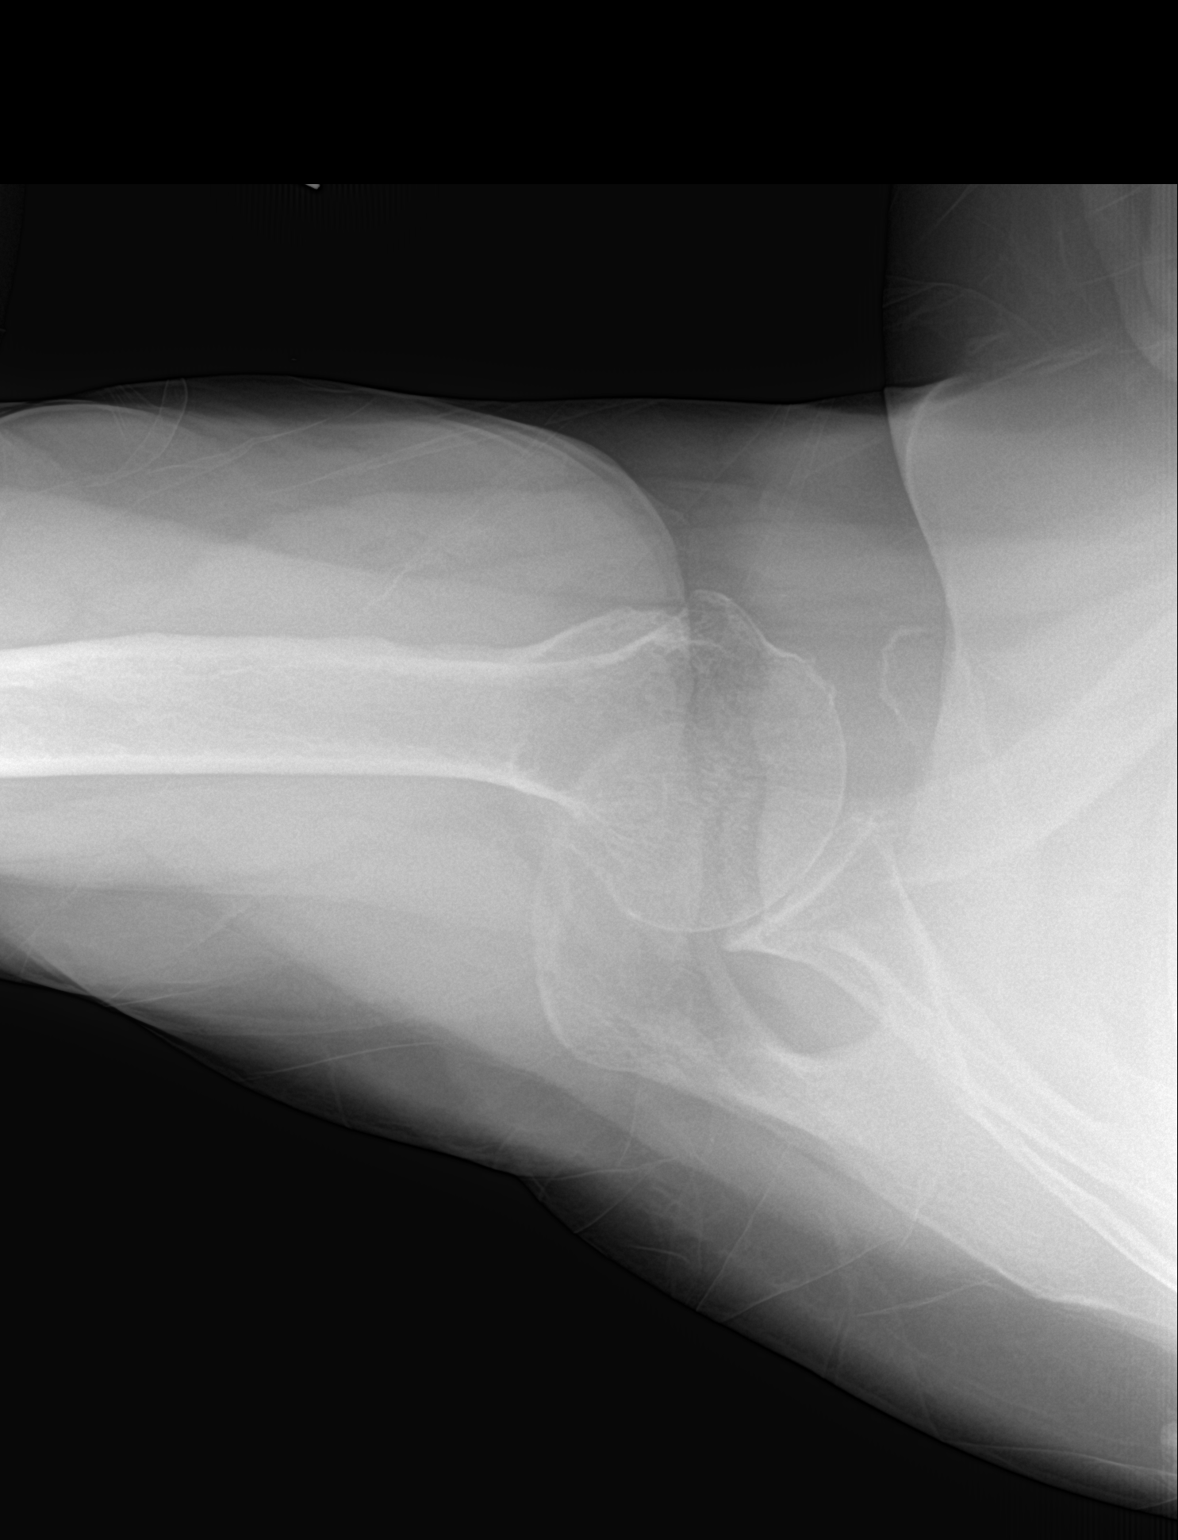

[3 of 3 positions shown; findings below may reference images not displayed]

FINDINGS: No fracture or dislocation.  Mild AC joint degenerative changes.
IMPRESSION: Mild AC joint degenerative changes. No other significant
abnormalities.

## 2022-06-01 ENCOUNTER — Emergency Department
Admission: EM | Admit: 2022-06-01 | Discharge: 2022-06-01 | Disposition: A | Discharge status: Home / Self Care | Payer: Medicare PPO | Attending: Student in an Organized Health Care Education/Training Program | Admitting: Student in an Organized Health Care Education/Training Program

## 2022-06-01 ENCOUNTER — Ambulatory Visit: Payer: Self-pay | Admitting: *Deleted

## 2022-06-01 ENCOUNTER — Emergency Department: Payer: Medicare PPO

## 2022-06-01 DIAGNOSIS — R0689 Other abnormalities of breathing: Secondary | ICD-10-CM | POA: Diagnosis not present

## 2022-06-01 DIAGNOSIS — R209 Unspecified disturbances of skin sensation: Secondary | ICD-10-CM | POA: Insufficient documentation

## 2022-06-01 DIAGNOSIS — R9431 Abnormal electrocardiogram [ECG] [EKG]: Secondary | ICD-10-CM | POA: Diagnosis not present

## 2022-06-01 DIAGNOSIS — F0390 Unspecified dementia without behavioral disturbance: Secondary | ICD-10-CM | POA: Insufficient documentation

## 2022-06-01 DIAGNOSIS — R9082 White matter disease, unspecified: Secondary | ICD-10-CM | POA: Diagnosis not present

## 2022-06-01 DIAGNOSIS — G309 Alzheimer's disease, unspecified: Secondary | ICD-10-CM | POA: Insufficient documentation

## 2022-06-01 DIAGNOSIS — R413 Other amnesia: Secondary | ICD-10-CM | POA: Diagnosis not present

## 2022-06-01 DIAGNOSIS — I10 Essential (primary) hypertension: Secondary | ICD-10-CM | POA: Diagnosis not present

## 2022-06-01 DIAGNOSIS — R200 Anesthesia of skin: Secondary | ICD-10-CM | POA: Diagnosis not present

## 2022-06-01 DIAGNOSIS — G319 Degenerative disease of nervous system, unspecified: Secondary | ICD-10-CM | POA: Diagnosis not present

## 2022-06-01 DIAGNOSIS — R4182 Altered mental status, unspecified: Secondary | ICD-10-CM | POA: Diagnosis not present

## 2022-06-01 DIAGNOSIS — R531 Weakness: Secondary | ICD-10-CM | POA: Diagnosis not present

## 2022-06-01 LAB — COMPREHENSIVE METABOLIC PANEL
ALT: 24 U/L (ref 0–44)
AST: 38 U/L (ref 15–41)
Albumin: 4.2 g/dL (ref 3.5–5.0)
Alkaline Phosphatase: 73 U/L (ref 38–126)
Anion gap: 11 (ref 5–15)
BUN: 17 mg/dL (ref 8–23)
CO2: 20 mmol/L — ABNORMAL LOW (ref 22–32)
Calcium: 9.2 mg/dL (ref 8.9–10.3)
Chloride: 109 mmol/L (ref 98–111)
Creatinine, Ser: 0.86 mg/dL (ref 0.44–1.00)
GFR, Estimated: >60 mL/min (ref >60)
Glucose, Bld: 100 mg/dL — ABNORMAL HIGH (ref 70–99)
Potassium: 4.5 mmol/L (ref 3.5–5.1)
Sodium: 140 mmol/L (ref 135–145)
Total Bilirubin: 1.1 mg/dL (ref 0.3–1.2)
Total Protein: 7.2 g/dL (ref 6.5–8.1)

## 2022-06-01 LAB — CBC
HCT: 44.8 % (ref 36.0–46.0)
Hemoglobin: 14.2 g/dL (ref 12.0–15.0)
MCH: 28.2 pg (ref 26.0–34.0)
MCHC: 31.7 g/dL (ref 30.0–36.0)
MCV: 88.9 fL (ref 80.0–100.0)
Platelets: 284 10*3/uL (ref 150–400)
RBC: 5.04 MIL/uL (ref 3.87–5.11)
RDW: 13.4 % (ref 11.5–15.5)
WBC: 4.5 10*3/uL (ref 4.0–10.5)
nRBC: 0 % (ref 0.0–0.2)

## 2022-06-01 LAB — DIFFERENTIAL
Abs Immature Granulocytes: 0.01 10*3/uL (ref 0.00–0.07)
Basophils Absolute: 0 10*3/uL (ref 0.0–0.1)
Basophils Relative: 1 %
Eosinophils Absolute: 0.1 10*3/uL (ref 0.0–0.5)
Eosinophils Relative: 1 %
Immature Granulocytes: 0 %
Lymphocytes Relative: 58 %
Lymphs Abs: 2.6 10*3/uL (ref 0.7–4.0)
Monocytes Absolute: 0.5 10*3/uL (ref 0.1–1.0)
Monocytes Relative: 10 %
Neutro Abs: 1.4 10*3/uL — ABNORMAL LOW (ref 1.7–7.7)
Neutrophils Relative %: 30 %

## 2022-06-01 LAB — PROTIME-INR
INR: 1 (ref 0.8–1.2)
Prothrombin Time: 12.8 seconds (ref 11.4–15.2)

## 2022-06-01 LAB — APTT: aPTT: 31 seconds (ref 24–36)

## 2022-06-01 NOTE — ED Notes (Signed)
EDP in room during triage. Ok with not calling code stroke.

## 2022-06-01 NOTE — ED Notes (Signed)
Daughter called and ETA of 15 min

## 2022-06-01 NOTE — ED Notes (Signed)
Pt assisted to toilet, given water.

## 2022-06-01 NOTE — Telephone Encounter (Signed)
  Chief Complaint: stroke like sx Symptoms: left side body numbness face and lips numb Frequency: started 1 hour ago  Pertinent Negatives: Patient denies na  Disposition: [x] ED /[] Urgent Care (no appt availability in office) / [] Appointment(In office/virtual)/ []  [facility]/ [] Home Care/ [] Refused Recommended Disposition /[] [place] [facility]/ []  Follow-up with PCP Additional Notes:   Offered to call 911 patient's daughter reports she will call 911 and get patient evaluated.      Reason for Disposition  [1] Numbness (i.e., loss of sensation) of the face, arm / hand, or leg / foot on one side of the body AND [2] sudden onset AND [3] present now  Answer Assessment - Initial Assessment Questions 1. SYMPTOM: "What is the main symptom you are concerned about?" (e.g., weakness, numbness)     Left side face left side of body numbness lips numb  2. ONSET: "When did this start?" (minutes, hours, days; while sleeping)     Less than 1 hour ago  3. LAST NORMAL: "When was the last time you (the patient) were normal (no symptoms)?"     1 hour ago  4. PATTERN "Does this come and go, or has it been constant since it started?"  "Is it present now?"     Present now  5. CARDIAC SYMPTOMS: "Have you had any of the following symptoms: chest pain, difficulty breathing, palpitations?"     na 6. NEUROLOGIC SYMPTOMS: "Have you had any of the following symptoms: headache, dizziness, vision loss, double vision, changes in speech, unsteady on your feet?"     Left side of body numbeness left face and lips  7. OTHER SYMPTOMS: "Do you have any other symptoms?"     Na  8. PREGNANCY: "Is there any chance you are pregnant?" "When was your last menstrual period?"     na  Protocols used: Neurologic Deficit-A-AH

## 2022-06-01 NOTE — Discharge Instructions (Signed)
Follow up with PCP

## 2022-06-01 NOTE — ED Notes (Signed)
This RN called daughter [name]. She stated the home health nurse told her to call EMS for left facial weakness. Daughter updated and she said she'll be here shortly.

## 2022-06-01 NOTE — ED Provider Notes (Signed)
[facility] Provider Note    Event Date/Time   First MD Initiated Contact with Patient 06/01/22 1117     (approximate)   History   Numbness   HPI  Erin Patrick is a 77 y.o. female history of Alzheimer's dementia presents to the ER for evaluation of several weeks to months of intermittent left-sided weakness.  She denies any pain.  Patient laughing playful and interactive with staff moving extremities spontaneously does not appear to have any deficits.  When asked why she is in the ER she states "I do not know, these people brought me here."     Physical Exam   Triage Vital Signs: ED Triage Vitals [06/01/22 1118]  Enc Vitals Group     BP      Pulse      Resp      Temp      Temp src      SpO2      Weight 178 lb 9.2 oz (81 kg)     Height 5\' 2"  (1.575 m)     Head Circumference      Peak Flow      Pain Score 0     Pain Loc      Pain Edu?      Excl. in GC?     Most recent vital signs: Vitals:   06/01/22 1127 06/01/22 1130  BP: (!) 142/74 122/83  Pulse:  78  Resp:  17  Temp:    SpO2: 99% 100%     Constitutional: Alert  Eyes: Conjunctivae are normal.  Head: Atraumatic. Nose: No congestion/rhinnorhea. Mouth/Throat: Mucous membranes are moist.   Neck: Painless ROM.  Cardiovascular:   Good peripheral circulation. Respiratory: Normal respiratory effort.  No retractions.  Gastrointestinal: Soft and nontender.  Musculoskeletal:  no deformity Neurologic:  CN- intact.  No facial droop, Normal FNF.  Normal heel to shin.  Sensation intact bilaterally. Normal speech and language. No gross focal neurologic deficits are appreciated. No gait instability.  Skin:  Skin is warm, dry and intact. No rash noted. Psychiatric: calm and cooperative. Speech and behavior are normal.    ED Results / Procedures / Treatments   Labs (all labs ordered are listed, but only abnormal results are displayed) Labs Reviewed  DIFFERENTIAL - Abnormal; Notable for  the following components:      Result Value   Neutro Abs 1.4 (*)    All other components within normal limits  COMPREHENSIVE METABOLIC PANEL - Abnormal; Notable for the following components:   CO2 20 (*)    Glucose, Bld 100 (*)    All other components within normal limits  PROTIME-INR  APTT  CBC  URINALYSIS, ROUTINE W REFLEX MICROSCOPIC     EKG  ED ECG REPORT I, Willy Eddy, the attending physician, personally viewed and interpreted this ECG.   Date: 06/01/2022  EKG Time: 11:29  Rate: 80  Rhythm: sinus  Axis: normal  Intervals: normal  ST&T Change: no stemi, no depressions    RADIOLOGY Please see ED Course for my review and interpretation.  I personally reviewed all radiographic images ordered to evaluate for the above acute complaints and reviewed radiology reports and findings.  These findings were personally discussed with the patient.  Please see medical record for radiology report.    PROCEDURES:  Critical Care performed: no  Procedures   MEDICATIONS ORDERED IN ED: Medications - No data to display   IMPRESSION / MDM / ASSESSMENT AND PLAN / ED  COURSE  I reviewed the triage vital signs and the nursing notes.                              Differential diagnosis includes, but is not limited to, cva, tia, hypoglycemia, dehydration, electrolyte abnormality, dissection, sepsis  Patient presenting to the ER for evaluation of symptoms as described above.  Based on symptoms, risk factors and considered above differential, this presenting complaint could reflect a potentially life-threatening illness therefore the patient will be placed on continuous pulse oximetry and telemetry for monitoring.  Laboratory evaluation will be sent to evaluate for the above complaints.      Clinical Course as of 06/01/22 1248  Mon Jun 01, 2022  1247 Patient's workup is reassuring.  Patient denies any concerns.  She does not have any focal deficits.  Is not consistent with  stroke or TIA.  Not consistent with dissection or sepsis.  Further conversation with patient I do suspect a large component of this is related to her chronic memory issues she states that she feels anxious sometimes and forgetful.  At this point do believe she requires hospitalization do believe she be appropriate for outpatient follow-up. [PR]    Clinical Course User Index [PR] Willy Eddy, MD     FINAL CLINICAL IMPRESSION(S) / ED DIAGNOSES   Final diagnoses:  Memory loss     Rx / DC Orders   ED Discharge Orders     None        Note:  This document was prepared using Dragon voice recognition software and may include unintentional dictation errors.    Willy Eddy, MD 06/01/22 1248
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
## 2022-06-03 ENCOUNTER — Telehealth: Payer: Self-pay

## 2022-06-03 NOTE — Transitions of Care (Post Inpatient/ED Visit) (Signed)
   06/03/2022  Name: Erin Patrick MRN: [id number] DOB: August 23, 1945  Today's TOC FU Call Status: Today's TOC FU Call Status:: Successful TOC FU Call Competed TOC FU Call Complete Date: 06/03/22  Transition Care Management Follow-up Telephone Call Date of Discharge: 06/01/22 Discharge Facility: [facility]) Type of Discharge: Emergency Department Reason for ED Visit: Other: How have you been since you were released from the hospital?: Better Any questions or concerns?: No  Items Reviewed: Did you receive and understand the discharge instructions provided?: Yes Medications obtained and verified?: Yes (Medications Reviewed) Any new allergies since your discharge?: No Dietary orders reviewed?: NA Do you have support at home?: Yes People in Home: child(ren), adult  Home Care and Equipment/Supplies: Were Home Health Services Ordered?: No Any new equipment or medical supplies ordered?: NA  Functional Questionnaire: Do you need assistance with bathing/showering or dressing?: Yes Do you need assistance with meal preparation?: Yes Do you need assistance with eating?: Yes Do you have difficulty maintaining continence: Yes Do you need assistance with getting out of bed/getting out of a chair/moving?: Yes Do you have difficulty managing or taking your medications?: Yes  Follow up appointments reviewed: PCP Follow-up appointment confirmed?: Yes MD Provider Line Number:[phone] Given: No Specialist Hospital Follow-up appointment confirmed?: NA Do you need transportation to your follow-up appointment?: Yes Do you understand care options if your condition(s) worsen?: Yes-patient verbalized understanding    SIGNATURE Malen Gauze, CMA

## 2022-06-03 NOTE — Telephone Encounter (Signed)
-----   Message from Pablo Ledger, [place] sent at 06/03/2022 11:49 AM EDT ----- Patient needs TOC call completed.

## 2022-06-18 ENCOUNTER — Ambulatory Visit: Payer: Medicare PPO | Admitting: Physician Assistant

## 2022-06-18 ENCOUNTER — Ambulatory Visit: Payer: Medicare PPO | Admitting: Family Medicine

## 2022-06-18 NOTE — Progress Notes (Deleted)
          Acute Office Visit   Patient: Erin Patrick   DOB: 24-Feb-1945   77 y.o. Female  MRN: [id number] Visit Date: 06/18/2022  Today's healthcare provider: Oswaldo Conroy Floraine Buechler, PA-C  Introduced myself to the patient as a Secondary school teacher and provided education on APPs in clinical practice.    No chief complaint on file.  Subjective    HPI    Medications: Outpatient Medications Prior to Visit  Medication Sig   ARIPiprazole (ABILIFY) 2 MG tablet Take 1 tablet (2 mg total) by mouth daily.   atorvastatin (LIPITOR) 10 MG tablet TAKE 1 TABLET BY MOUTH ONCE EVERY EVENING   cetirizine (ZYRTEC) 10 MG tablet Take 1 tablet (10 mg total) by mouth daily.   diclofenac Sodium (VOLTAREN) 1 % GEL Apply 4 g topically 4 (four) times daily.   donepezil (ARICEPT) 10 MG tablet Take half tablet (5 mg) daily for 2 weeks, then increase to the full tablet at 10 mg daily   fluticasone (FLONASE) 50 MCG/ACT nasal spray Place 2 sprays into both nostrils daily.   gabapentin (NEURONTIN) 100 MG capsule Take 1 capsule (100 mg total) by mouth 2 (two) times daily.   methocarbamol (ROBAXIN) 500 MG tablet Take 1 tablet (500 mg total) by mouth every 12 (twelve) hours as needed for muscle spasms.   metroNIDAZOLE (FLAGYL) 500 MG tablet Take 1 tablet (500 mg total) by mouth 2 (two) times daily.   phenazopyridine (PYRIDIUM) 100 MG tablet Take 1 tablet (100 mg total) by mouth 3 (three) times daily as needed for pain.   sertraline (ZOLOFT) 25 MG tablet Take 1 tablet (25 mg total) by mouth at bedtime.   tamsulosin (FLOMAX) 0.4 MG CAPS capsule Take 1 capsule (0.4 mg total) by mouth daily.   No facility-administered medications prior to visit.    Review of Systems  {Labs  Heme  Chem  Endocrine  Serology  Results Review (optional):23779}   Objective    LMP  (LMP Unknown)  {Show previous vital signs (optional):23777}  Physical Exam    No results found for any visits on 06/18/22.  Assessment & Plan      No follow-ups on file.

## 2022-06-29 ENCOUNTER — Ambulatory Visit: Payer: Medicare PPO | Admitting: Nurse Practitioner

## 2022-06-29 ENCOUNTER — Encounter: Payer: Self-pay | Admitting: Nurse Practitioner

## 2022-06-29 VITALS — BP 112/78 | HR 66 | Temp 97.9°F | Wt 178.0 lb

## 2022-06-29 DIAGNOSIS — R300 Dysuria: Secondary | ICD-10-CM | POA: Diagnosis not present

## 2022-06-29 DIAGNOSIS — N3001 Acute cystitis with hematuria: Secondary | ICD-10-CM | POA: Diagnosis not present

## 2022-06-29 LAB — URINALYSIS, ROUTINE W REFLEX MICROSCOPIC
Bilirubin, UA: NEGATIVE
Glucose, UA: NEGATIVE
Ketones, UA: NEGATIVE
Leukocytes,UA: NEGATIVE
Nitrite, UA: NEGATIVE
Protein,UA: NEGATIVE
Specific Gravity, UA: >1.03 — ABNORMAL HIGH (ref 1.005–1.030)
Urobilinogen, Ur: 0.2 mg/dL (ref 0.2–1.0)
pH, UA: 5 (ref 5.0–7.5)

## 2022-06-29 LAB — MICROSCOPIC EXAMINATION: Bacteria, UA: NONE SEEN

## 2022-06-29 MED ORDER — NITROFURANTOIN MONOHYD MACRO 100 MG PO CAPS: 100.0000 mg | ORAL_CAPSULE | Freq: Two times a day (BID) | ORAL | 0 refills | Status: DC

## 2022-06-29 NOTE — Progress Notes (Signed)
BP 112/78   Pulse 66   Temp 97.9 F (36.6 C) (Oral)   Wt 178 lb (80.7 kg)   LMP  (LMP Unknown)   SpO2 97%   BMI 32.56 kg/m    Subjective:    Patient ID: Erin Patrick, female    DOB: 09/22/1945, 77 y.o.   MRN: [id number]  HPI: Erin Patrick is a 77 y.o. female  Chief Complaint  Patient presents with   Urinary Tract Infection   URINARY SYMPTOMS Patient states symptoms started about 3-4 days ago.   Dysuria: yes Urinary frequency: no Urgency: yes Small volume voids: yes Symptom severity: no Urinary incontinence: no Foul odor: no Hematuria: no Abdominal pain: no Back pain: no Suprapubic pain/pressure: yes Flank pain: yes Fever:  no Vomiting: no Relief with cranberry juice: no Relief with pyridium: no Status: worse Previous urinary tract infection: no Recurrent urinary tract infection: no Sexual activity: No sexually active/monogomous/practicing safe sex History of sexually transmitted disease: no Penile discharge: no Treatments attempted: increasing fluids   Relevant past medical, surgical, family and social history reviewed and updated as indicated. Interim medical history since our last visit reviewed. Allergies and medications reviewed and updated.  Review of Systems  Constitutional:  Negative for fever.  Gastrointestinal:  Negative for abdominal pain and vomiting.  Genitourinary:  Positive for decreased urine volume, dysuria, pelvic pain and urgency. Negative for flank pain, frequency and hematuria.  Musculoskeletal:  Negative for back pain.    Per HPI unless specifically indicated above     Objective:    BP 112/78   Pulse 66   Temp 97.9 F (36.6 C) (Oral)   Wt 178 lb (80.7 kg)   LMP  (LMP Unknown)   SpO2 97%   BMI 32.56 kg/m   Wt Readings from Last 3 Encounters:  06/29/22 178 lb (80.7 kg)  06/01/22 178 lb 9.2 oz (81 kg)  04/23/22 178 lb 9.6 oz (81 kg)    Physical Exam Vitals and nursing note reviewed.  Constitutional:      General: She is  not in acute distress.    Appearance: Normal appearance. She is normal weight. She is not ill-appearing, toxic-appearing or diaphoretic.  HENT:     Head: Normocephalic.     Right Ear: External ear normal.     Left Ear: External ear normal.     Nose: Nose normal.     Mouth/Throat:     Mouth: Mucous membranes are moist.     Pharynx: Oropharynx is clear.  Eyes:     General:        Right eye: No discharge.        Left eye: No discharge.     Extraocular Movements: Extraocular movements intact.     Conjunctiva/sclera: Conjunctivae normal.     Pupils: Pupils are equal, round, and reactive to light.  Cardiovascular:     Rate and Rhythm: Normal rate and regular rhythm.     Heart sounds: No murmur heard. Pulmonary:     Effort: Pulmonary effort is normal. No respiratory distress.     Breath sounds: Normal breath sounds. No wheezing or rales.  Abdominal:     General: Abdomen is flat. Bowel sounds are normal. There is no distension.     Palpations: Abdomen is soft.     Tenderness: There is no abdominal tenderness. There is no right CVA tenderness, left CVA tenderness or guarding.  Musculoskeletal:     Cervical back: Normal range of motion and neck supple.  Skin:    General: Skin is warm and dry.     Capillary Refill: Capillary refill takes less than 2 seconds.  Neurological:     General: No focal deficit present.     Mental Status: She is alert and oriented to person, place, and time. Mental status is at baseline.  Psychiatric:        Mood and Affect: Mood normal.        Behavior: Behavior normal.        Thought Content: Thought content normal.        Judgment: Judgment normal.     Results for orders placed or performed during the hospital encounter of 06/01/22  Protime-INR  Result Value Ref Range   Prothrombin Time 12.8 11.4 - 15.2 seconds   INR 1.0 0.8 - 1.2  APTT  Result Value Ref Range   aPTT 31 24 - 36 seconds  CBC  Result Value Ref Range   WBC 4.5 4.0 - 10.5 K/uL   RBC  5.04 3.87 - 5.11 MIL/uL   Hemoglobin 14.2 12.0 - 15.0 g/dL   HCT 16.1 09.6 - 04.5 %   MCV 88.9 80.0 - 100.0 fL   MCH 28.2 26.0 - 34.0 pg   MCHC 31.7 30.0 - 36.0 g/dL   RDW 40.9 81.1 - 91.4 %   Platelets 284 150 - 400 K/uL   nRBC 0.0 0.0 - 0.2 %  Differential  Result Value Ref Range   Neutrophils Relative % 30 %   Neutro Abs 1.4 (L) 1.7 - 7.7 K/uL   Lymphocytes Relative 58 %   Lymphs Abs 2.6 0.7 - 4.0 K/uL   Monocytes Relative 10 %   Monocytes Absolute 0.5 0.1 - 1.0 K/uL   Eosinophils Relative 1 %   Eosinophils Absolute 0.1 0.0 - 0.5 K/uL   Basophils Relative 1 %   Basophils Absolute 0.0 0.0 - 0.1 K/uL   Immature Granulocytes 0 %   Abs Immature Granulocytes 0.01 0.00 - 0.07 K/uL  Comprehensive metabolic panel  Result Value Ref Range   Sodium 140 135 - 145 mmol/L   Potassium 4.5 3.5 - 5.1 mmol/L   Chloride 109 98 - 111 mmol/L   CO2 20 (L) 22 - 32 mmol/L   Glucose, Bld 100 (H) 70 - 99 mg/dL   BUN 17 8 - 23 mg/dL   Creatinine, Ser 7.82 0.44 - 1.00 mg/dL   Calcium 9.2 8.9 - 95.6 mg/dL   Total Protein 7.2 6.5 - 8.1 g/dL   Albumin 4.2 3.5 - 5.0 g/dL   AST 38 15 - 41 U/L   ALT 24 0 - 44 U/L   Alkaline Phosphatase 73 38 - 126 U/L   Total Bilirubin 1.1 0.3 - 1.2 mg/dL   GFR, Estimated >21 >30 mL/min   Anion gap 11 5 - 15      Assessment & Plan:   Problem List Items Addressed This Visit   None Visit Diagnoses     Acute cystitis with hematuria    -  Primary   Will treat with macrobid.  Urine sent for culture.  Will change medication if needed based on culture results. Increase fluids. FU if not improved.   Relevant Orders   Urine Culture   Dysuria       Relevant Orders   Urinalysis, Routine w reflex microscopic        Follow up plan: Return if symptoms worsen or fail to improve.

## 2022-06-30 NOTE — Progress Notes (Signed)
Results discussed with patient during visit.

## 2022-07-02 ENCOUNTER — Telehealth: Payer: Self-pay | Admitting: Family Medicine

## 2022-07-02 ENCOUNTER — Telehealth: Payer: Self-pay | Admitting: *Deleted

## 2022-07-02 LAB — URINE CULTURE

## 2022-07-02 NOTE — Telephone Encounter (Signed)
Patient's daughter notified of Karen's response.

## 2022-07-02 NOTE — Patient Outreach (Addendum)
  Care Coordination   Initial Visit Note   07/02/2022 Name: Erin Patrick MRN: [id number] DOB: [birth year]  Erin Patrick is a 77 y.o. year old female who sees Dorcas Carrow, DO for primary care. I spoke with  Erin Patrick 's daughter by phone today.  What matters to the patients health and wellness today?  Caregiver stress-Patient's daughter requesting assistance with memory care placement for patient.    Goals Addressed             This Visit's Progress    Long Term Care-Memory Care Placement       Care Coordination Interventions: Caregiver stress acknowledged-alternatives to care discussed including Adult Day Program option and Placement in memory care Caregiver agreeable to Day Program consideration-contact information provided Caregiver agreeable to beginning application process for long term care Medicaid          SDOH assessments and interventions completed:  Yes  SDOH Interventions Today    Flowsheet Row Most Recent Value  SDOH Interventions   Food Insecurity Interventions Intervention Not Indicated  Housing Interventions Intervention Not Indicated  Transportation Interventions Intervention Not Indicated        Care Coordination Interventions:  Yes, provided  Interventions Today    Flowsheet Row Most Recent Value  Chronic Disease   Chronic disease during today's visit Other  [dementia]  General Interventions   General Interventions Discussed/Reviewed General Interventions Discussed, Community Resources, Level of Care  Level of Care Adult Daycare, Skilled Nursing Facility, Air traffic controller  [Discussed Adult Day Care option until LTC placement cn be arranged]  Applications Medicaid  [confirmed need to apply for long term care Medicaid on patient's behalf for placement in memory care]  Education Interventions   Education Provided Provided Education  Provided Verbal Education On Applications  [employer] medicaid application process discussed]  Applications Medicaid   [confirmed need to apply for long term care Medicaid on patient's behalf for placement in memory care]  Safety Interventions   Safety Discussed/Reviewed Safety Discussed       Follow up plan: Follow up call scheduled for 07/15/22    Encounter Outcome:  Pt. Visit Completed

## 2022-07-02 NOTE — Telephone Encounter (Signed)
Copied from CRM [id number]. Topic: General - Inquiry >> Jul 02, 2022  9:54 AM Marlow Baars wrote: Reason for CRM: [name] the daughter of the patient called in asking to speak with the social worker, [employer]. The number I have listed for Chrystal is no longer in service. Please assist patient further

## 2022-07-02 NOTE — Progress Notes (Signed)
Please let patient know that her urine did now grow any bacteria.  No need to complete the course of antibiotics.  Follow up if symptoms do not resolve.

## 2022-07-02 NOTE — Telephone Encounter (Signed)
Copied from CRM [id number]. Topic: General - Inquiry >> Jul 02, 2022  9:45 AM Erin Patrick wrote: Reason for CRM: The patients daughter called in stating her mother, the patient is not taking her medication for uti and bacterial vaginosis. The daughter is very concerned with it getting worse and has questioned if there is some type of shot or other treatment method to help treat the patient. Please assist further

## 2022-07-02 NOTE — Telephone Encounter (Signed)
Patient does not have a UTI.  She doesn't need to take the Nitrofurantoin.  There was no growth on her culture.  She should take the Flagyl (Metronidizole) to help with the bacterial vaginosis.  There is not a shot that can be given for that unfortunately.

## 2022-07-02 NOTE — Patient Instructions (Signed)
Visit Information  Thank you for taking time to visit with me today. Please don't hesitate to contact me if I can be of assistance to you.   Following are the goals we discussed today:   Goals Addressed             This Visit's Progress    Long Term Care-Memory Care Placement       Care Coordination Interventions: Caregiver stress acknowledged-alternatives to care discussed including Adult Day Program option and Placement in memory care Caregiver agreeable to Day Program consideration-contact information provided Caregiver agreeable to beginning application process for long term care Medicaid          Our next appointment is by telephone on 07/15/22 at 9"30am  Please call the care guide team at [phone] if you need to cancel or reschedule your appointment.   If you are experiencing a Mental Health or Behavioral Health Crisis or need someone to talk to, please call [phone]   Patient verbalizes understanding of instructions and care plan provided today and agrees to view in MyChart. Active MyChart status and patient understanding of how to access instructions and care plan via MyChart confirmed with patient.     Telephone follow up appointment with care management team member scheduled for: 07/15/22  Verna Czech, LCSW Clinical Social Worker  [facility] Care Management [phone]

## 2022-07-09 ENCOUNTER — Encounter: Payer: Self-pay | Admitting: Family Medicine

## 2022-07-09 ENCOUNTER — Ambulatory Visit: Payer: Medicare PPO | Admitting: Family Medicine

## 2022-07-09 VITALS — BP 135/71 | HR 74 | Temp 97.8°F | Ht 62.0 in | Wt 181.2 lb

## 2022-07-09 DIAGNOSIS — R8290 Unspecified abnormal findings in urine: Secondary | ICD-10-CM | POA: Diagnosis not present

## 2022-07-09 DIAGNOSIS — M545 Low back pain, unspecified: Secondary | ICD-10-CM

## 2022-07-09 DIAGNOSIS — G309 Alzheimer's disease, unspecified: Secondary | ICD-10-CM

## 2022-07-09 DIAGNOSIS — E1142 Type 2 diabetes mellitus with diabetic polyneuropathy: Secondary | ICD-10-CM

## 2022-07-09 DIAGNOSIS — E1169 Type 2 diabetes mellitus with other specified complication: Secondary | ICD-10-CM | POA: Diagnosis not present

## 2022-07-09 DIAGNOSIS — E785 Hyperlipidemia, unspecified: Secondary | ICD-10-CM | POA: Diagnosis not present

## 2022-07-09 DIAGNOSIS — N898 Other specified noninflammatory disorders of vagina: Secondary | ICD-10-CM

## 2022-07-09 DIAGNOSIS — G8929 Other chronic pain: Secondary | ICD-10-CM

## 2022-07-09 DIAGNOSIS — F0280 Dementia in other diseases classified elsewhere without behavioral disturbance: Secondary | ICD-10-CM

## 2022-07-09 DIAGNOSIS — Z111 Encounter for screening for respiratory tuberculosis: Secondary | ICD-10-CM | POA: Diagnosis not present

## 2022-07-09 LAB — URINALYSIS, ROUTINE W REFLEX MICROSCOPIC
Bilirubin, UA: NEGATIVE
Glucose, UA: NEGATIVE
Ketones, UA: NEGATIVE
Leukocytes,UA: NEGATIVE
Nitrite, UA: NEGATIVE
Protein,UA: NEGATIVE
RBC, UA: NEGATIVE
Specific Gravity, UA: >1.03 — ABNORMAL HIGH (ref 1.005–1.030)
Urobilinogen, Ur: 0.2 mg/dL (ref 0.2–1.0)
pH, UA: 5.5 (ref 5.0–7.5)

## 2022-07-09 LAB — BAYER DCA HB A1C WAIVED: HB A1C (BAYER DCA - WAIVED): 6.1 % — ABNORMAL HIGH (ref 4.8–5.6)

## 2022-07-09 LAB — WET PREP FOR TRICH, YEAST, CLUE
Clue Cell Exam: NEGATIVE
Trichomonas Exam: NEGATIVE
Yeast Exam: NEGATIVE

## 2022-07-09 MED ORDER — DONEPEZIL HCL 10 MG PO TABS: 10.0000 mg | ORAL_TABLET | Freq: Every day | ORAL | 6 refills | Status: DC

## 2022-07-09 MED ORDER — GABAPENTIN 100 MG PO CAPS: 100.0000 mg | ORAL_CAPSULE | Freq: Two times a day (BID) | ORAL | 6 refills | Status: DC

## 2022-07-09 MED ORDER — ATORVASTATIN CALCIUM 10 MG PO TABS
ORAL_TABLET | ORAL | 6 refills | Status: DC
Start: 2022-07-09 — End: 2023-03-22

## 2022-07-09 NOTE — Progress Notes (Signed)
BP 135/71 (BP Location: Right Arm, Cuff Size: Normal)   Pulse 74   Temp 97.8 F (36.6 C) (Oral)   Ht 5\' 2"  (1.575 m)   Wt 181 lb 3.2 oz (82.2 kg)   LMP  (LMP Unknown)   SpO2 99%   BMI 33.14 kg/m    Subjective:    Patient ID: Erin Patrick, female    DOB: 12-09-1945, 77 y.o.   MRN: [id number]  HPI: Erin Patrick is a 77 y.o. female  Chief Complaint  Patient presents with   Back Pain    Patient daughter says patient has been complaining of upper buttock pain that she has had surgery on in the past. Patient daughter is requesting a MRI to make sure the issues hasn't returned.    DIABETES Hypoglycemic episodes:no Polydipsia/polyuria: no Visual disturbance: no Chest pain: no Paresthesias: no Glucose Monitoring: no Taking Insulin?: no Blood Pressure Monitoring: not checking Retinal Examination: Not up to Date Foot Exam: Up to Date Diabetic Education: Completed Pneumovax: Up to Date Influenza: Up to Date Aspirin: no  HYPERTENSION / HYPERLIPIDEMIA Satisfied with current treatment? Has not been taking medicine for unknown amount of time Duration of hypertension: chronic BP monitoring frequency: not checking BP medication side effects: N/A Duration of hyperlipidemia: chronic Cholesterol medication side effects: no Cholesterol supplements: none Past cholesterol medications: atorvastatin Medication compliance: poor compliance Aspirin: no Recent stressors: yes Recurrent headaches: no Visual changes: no Palpitations: no Dyspnea: no Chest pain: no Lower extremity edema: no Dizzy/lightheaded: no  BACK PAIN- had surgery 30 years and then started hurting when she's walking and pulling Duration: weeks Mechanism of injury: no trauma Location: midline, bilateral, and low back Onset: gradual Severity: mild Quality: zinging Frequency: constant Radiation: none Aggravating factors: walking and pulling Alleviating factors: none Status: worse Treatments attempted: none   Relief with NSAIDs?: no Nighttime pain:  no Paresthesias / decreased sensation:  no Bowel / bladder incontinence:  no Fevers:  no Dysuria / urinary frequency:  no  Daughters are taking her to an adult daycare. Needs paperwork filled out for her to go. No other concerns or complaints at this time.   Relevant past medical, surgical, family and social history reviewed and updated as indicated. Interim medical history since our last visit reviewed. Allergies and medications reviewed and updated.  Review of Systems  Constitutional: Negative.   Respiratory: Negative.    Cardiovascular: Negative.   Gastrointestinal: Negative.   Genitourinary:        Bad smell to urine  Musculoskeletal:  Positive for back pain and myalgias. Negative for arthralgias, gait problem, joint swelling, neck pain and neck stiffness.  Skin: Negative.   Neurological: Negative.   Psychiatric/Behavioral: Negative.      Per HPI unless specifically indicated above     Objective:    BP 135/71 (BP Location: Right Arm, Cuff Size: Normal)   Pulse 74   Temp 97.8 F (36.6 C) (Oral)   Ht 5\' 2"  (1.575 m)   Wt 181 lb 3.2 oz (82.2 kg)   LMP  (LMP Unknown)   SpO2 99%   BMI 33.14 kg/m   Wt Readings from Last 3 Encounters:  07/09/22 181 lb 3.2 oz (82.2 kg)  06/29/22 178 lb (80.7 kg)  06/01/22 178 lb 9.2 oz (81 kg)    Physical Exam Vitals and nursing note reviewed.  Constitutional:      General: She is not in acute distress.    Appearance: Normal appearance. She is not ill-appearing, toxic-appearing or diaphoretic.  HENT:     Head: Normocephalic and atraumatic.     Right Ear: External ear normal.     Left Ear: External ear normal.     Nose: Nose normal.     Mouth/Throat:     Mouth: Mucous membranes are moist.     Pharynx: Oropharynx is clear.  Eyes:     General: No scleral icterus.       Right eye: No discharge.        Left eye: No discharge.     Extraocular Movements: Extraocular movements intact.      Conjunctiva/sclera: Conjunctivae normal.     Pupils: Pupils are equal, round, and reactive to light.  Cardiovascular:     Rate and Rhythm: Normal rate and regular rhythm.     Pulses: Normal pulses.     Heart sounds: Normal heart sounds. No murmur heard.    No friction rub. No gallop.  Pulmonary:     Effort: Pulmonary effort is normal. No respiratory distress.     Breath sounds: Normal breath sounds. No stridor. No wheezing, rhonchi or rales.  Chest:     Chest wall: No tenderness.  Musculoskeletal:        General: Normal range of motion.     Cervical back: Normal range of motion and neck supple.  Skin:    General: Skin is warm and dry.     Capillary Refill: Capillary refill takes less than 2 seconds.     Coloration: Skin is not jaundiced or pale.     Findings: No bruising, erythema, lesion or rash.  Neurological:     General: No focal deficit present.     Mental Status: She is alert and oriented to person, place, and time. Mental status is at baseline.  Psychiatric:        Mood and Affect: Mood normal.        Behavior: Behavior normal.        Thought Content: Thought content normal.        Judgment: Judgment normal.     Results for orders placed or performed in visit on 07/09/22  WET PREP FOR TRICH, YEAST, CLUE   Specimen: Urine   Urine  Result Value Ref Range   Trichomonas Exam Negative Negative   Yeast Exam Negative Negative   Clue Cell Exam Negative Negative  Urinalysis, Routine w reflex microscopic  Result Value Ref Range   Specific Gravity, UA >1.030 (H) 1.005 - 1.030   pH, UA 5.5 5.0 - 7.5   Color, UA Yellow Yellow   Appearance Ur Cloudy (A) Clear   Leukocytes,UA Negative Negative   Protein,UA Negative Negative/Trace   Glucose, UA Negative Negative   Ketones, UA Negative Negative   RBC, UA Negative Negative   Bilirubin, UA Negative Negative   Urobilinogen, Ur 0.2 0.2 - 1.0 mg/dL   Nitrite, UA Negative Negative   Microscopic Examination Comment   Bayer DCA Hb  A1c Waived  Result Value Ref Range   HB A1C (BAYER DCA - WAIVED) 6.1 (H) 4.8 - 5.6 %  CBC with Differential/Platelet  Result Value Ref Range   WBC 4.9 3.4 - 10.8 x10E3/uL   RBC 5.03 3.77 - 5.28 x10E6/uL   Hemoglobin 14.1 11.1 - 15.9 g/dL   Hematocrit 40.9 81.1 - 46.6 %   MCV 85 79 - 97 fL   MCH 28.0 26.6 - 33.0 pg   MCHC 33.2 31.5 - 35.7 g/dL   RDW 91.4 78.2 - 95.6 %   Platelets  293 150 - 450 x10E3/uL   Neutrophils 43 Not Estab. %   Lymphs 49 Not Estab. %   Monocytes 6 Not Estab. %   Eos 1 Not Estab. %   Basos 1 Not Estab. %   Neutrophils Absolute 2.1 1.4 - 7.0 x10E3/uL   Lymphocytes Absolute 2.4 0.7 - 3.1 x10E3/uL   Monocytes Absolute 0.3 0.1 - 0.9 x10E3/uL   EOS (ABSOLUTE) 0.1 0.0 - 0.4 x10E3/uL   Basophils Absolute 0.0 0.0 - 0.2 x10E3/uL   Immature Granulocytes 0 Not Estab. %   Immature Grans (Abs) 0.0 0.0 - 0.1 x10E3/uL  Comprehensive metabolic panel  Result Value Ref Range   Glucose 85 70 - 99 mg/dL   BUN 13 8 - 27 mg/dL   Creatinine, Ser 4.09 (H) 0.57 - 1.00 mg/dL   eGFR 53 (L) >81 XB/JYN/8.29   BUN/Creatinine Ratio 12 12 - 28   Sodium 146 (H) 134 - 144 mmol/L   Potassium 4.2 3.5 - 5.2 mmol/L   Chloride 107 (H) 96 - 106 mmol/L   CO2 21 20 - 29 mmol/L   Calcium 9.4 8.7 - 10.3 mg/dL   Total Protein 6.9 6.0 - 8.5 g/dL   Albumin 4.5 3.8 - 4.8 g/dL   Globulin, Total 2.4 1.5 - 4.5 g/dL   Albumin/Globulin Ratio 1.9 1.2 - 2.2   Bilirubin Total 0.3 0.0 - 1.2 mg/dL   Alkaline Phosphatase 103 44 - 121 IU/L   AST 32 0 - 40 IU/L   ALT 28 0 - 32 IU/L  Lipid Panel w/o Chol/HDL Ratio  Result Value Ref Range   Cholesterol, Total 233 (H) 100 - 199 mg/dL   Triglycerides 54 0 - 149 mg/dL   HDL 69 >56 mg/dL   VLDL Cholesterol Cal 9 5 - 40 mg/dL   LDL Chol Calc (NIH) 213 (H) 0 - 99 mg/dL  QuantiFERON-TB Gold Plus  Result Value Ref Range   QuantiFERON Incubation WILL FOLLOW    QuantiFERON Criteria WILL FOLLOW    QuantiFERON TB1 Ag Value WILL FOLLOW    QuantiFERON TB2 Ag Value  WILL FOLLOW    QuantiFERON Nil Value WILL FOLLOW    QuantiFERON Mitogen Value WILL FOLLOW    QuantiFERON-TB Gold Plus WILL FOLLOW       Assessment & Plan:   Problem List Items Addressed This Visit       Endocrine   Type 2 diabetes mellitus with diabetic polyneuropathy (HCC)    Doing well with A1c of 6.1. Diet controlled. Will not start medicine. Continue to monitor.       Relevant Medications   atorvastatin (LIPITOR) 10 MG tablet   donepezil (ARICEPT) 10 MG tablet   gabapentin (NEURONTIN) 100 MG capsule   Other Relevant Orders   Bayer DCA Hb A1c Waived (Completed)   CBC with Differential/Platelet (Completed)   Comprehensive metabolic panel (Completed)   Lipid Panel w/o Chol/HDL Ratio (Completed)   Hyperlipidemia associated with type 2 diabetes mellitus (HCC)    Will restart her atorvastatin. Rx sent to her pharmacy. Call with any concerns.       Relevant Medications   atorvastatin (LIPITOR) 10 MG tablet     Nervous and Auditory   Dementia due to Alzheimer's disease    Has not followed up with neurology since fall 2023. Appointment scheduled today. Will not restart her medicine at this time- will defer to them. Family getting her into adult daycare. I think this is a very good idea. We will get labs and  fill out paperwork.       Relevant Medications   donepezil (ARICEPT) 10 MG tablet   gabapentin (NEURONTIN) 100 MG capsule   Other Visit Diagnoses     Chronic bilateral low back pain without sciatica    -  Primary   Will check x-ray. Await results.   Relevant Medications   gabapentin (NEURONTIN) 100 MG capsule   Other Relevant Orders   DG Lumbar Spine Complete   Vaginal discharge       Wet prep negative.   Relevant Orders   Urinalysis, Routine w reflex microscopic (Completed)   WET PREP FOR TRICH, YEAST, CLUE (Completed)   Bad odor of urine       UA clear.   Relevant Orders   Urinalysis, Routine w reflex microscopic (Completed)   WET PREP FOR TRICH, YEAST, CLUE  (Completed)   Screening for tuberculosis       Labs drawn today. Await results. Treat as needed.   Relevant Orders   QuantiFERON-TB Gold Plus (Completed)        Follow up plan: Return in about 4 weeks (around 08/06/2022).

## 2022-07-09 NOTE — Patient Instructions (Signed)
Patient is scheduled with Dr Vanetta Shawl at [facility] on July 25th at 9:00 AM. [facility] new location address will be [address], [address] in [place].

## 2022-07-10 ENCOUNTER — Telehealth: Payer: Self-pay | Admitting: Family Medicine

## 2022-07-10 LAB — CBC WITH DIFFERENTIAL/PLATELET
Basophils Absolute: 0 10*3/uL (ref 0.0–0.2)
Basos: 1 %
EOS (ABSOLUTE): 0.1 10*3/uL (ref 0.0–0.4)
Eos: 1 %
Hematocrit: 42.5 % (ref 34.0–46.6)
Hemoglobin: 14.1 g/dL (ref 11.1–15.9)
Immature Grans (Abs): 0 10*3/uL (ref 0.0–0.1)
Immature Granulocytes: 0 %
Lymphocytes Absolute: 2.4 10*3/uL (ref 0.7–3.1)
Lymphs: 49 %
MCH: 28 pg (ref 26.6–33.0)
MCHC: 33.2 g/dL (ref 31.5–35.7)
MCV: 85 fL (ref 79–97)
Monocytes Absolute: 0.3 10*3/uL (ref 0.1–0.9)
Monocytes: 6 %
Neutrophils Absolute: 2.1 10*3/uL (ref 1.4–7.0)
Neutrophils: 43 %
Platelets: 293 10*3/uL (ref 150–450)
RBC: 5.03 x10E6/uL (ref 3.77–5.28)
RDW: 12.9 % (ref 11.7–15.4)
WBC: 4.9 10*3/uL (ref 3.4–10.8)

## 2022-07-10 LAB — LIPID PANEL W/O CHOL/HDL RATIO
Cholesterol, Total: 233 mg/dL — ABNORMAL HIGH (ref 100–199)
HDL: 69 mg/dL (ref >39)
LDL Chol Calc (NIH): 155 mg/dL — ABNORMAL HIGH (ref 0–99)
Triglycerides: 54 mg/dL (ref 0–149)
VLDL Cholesterol Cal: 9 mg/dL (ref 5–40)

## 2022-07-10 LAB — COMPREHENSIVE METABOLIC PANEL
ALT: 28 IU/L (ref 0–32)
AST: 32 IU/L (ref 0–40)
Albumin/Globulin Ratio: 1.9 (ref 1.2–2.2)
Albumin: 4.5 g/dL (ref 3.8–4.8)
Alkaline Phosphatase: 103 IU/L (ref 44–121)
BUN/Creatinine Ratio: 12 (ref 12–28)
BUN: 13 mg/dL (ref 8–27)
Bilirubin Total: 0.3 mg/dL (ref 0.0–1.2)
CO2: 21 mmol/L (ref 20–29)
Calcium: 9.4 mg/dL (ref 8.7–10.3)
Chloride: 107 mmol/L — ABNORMAL HIGH (ref 96–106)
Creatinine, Ser: 1.07 mg/dL — ABNORMAL HIGH (ref 0.57–1.00)
Globulin, Total: 2.4 g/dL (ref 1.5–4.5)
Glucose: 85 mg/dL (ref 70–99)
Potassium: 4.2 mmol/L (ref 3.5–5.2)
Sodium: 146 mmol/L — ABNORMAL HIGH (ref 134–144)
Total Protein: 6.9 g/dL (ref 6.0–8.5)
eGFR: 53 mL/min/{1.73_m2} — ABNORMAL LOW (ref >59)

## 2022-07-10 NOTE — Telephone Encounter (Signed)
Copied from CRM [id number]. Topic: General - Inquiry >> Jul 10, 2022 11:42 AM Clide Dales wrote: Angelica Chessman with [facility] called to get an updated med list for patient.

## 2022-07-10 NOTE — Telephone Encounter (Signed)
Requested medication listed was printed and faxed to [facility] with [facility] at [phone].

## 2022-07-11 LAB — QUANTIFERON-TB GOLD PLUS

## 2022-07-11 NOTE — Assessment & Plan Note (Signed)
Has not followed up with neurology since fall 2023. Appointment scheduled today. Will not restart her medicine at this time- will defer to them. Family getting her into adult daycare. I think this is a very good idea. We will get labs and fill out paperwork.

## 2022-07-11 NOTE — Assessment & Plan Note (Signed)
Will restart her atorvastatin. Rx sent to her pharmacy. Call with any concerns.

## 2022-07-11 NOTE — Assessment & Plan Note (Signed)
Doing well with A1c of 6.1. Diet controlled. Will not start medicine. Continue to monitor.

## 2022-07-13 LAB — QUANTIFERON-TB GOLD PLUS
QuantiFERON Mitogen Value: >10 IU/mL
QuantiFERON TB1 Ag Value: 0.07 IU/mL
QuantiFERON-TB Gold Plus: NEGATIVE

## 2022-07-14 ENCOUNTER — Ambulatory Visit
Admission: RE | Admit: 2022-07-14 | Discharge: 2022-07-14 | Disposition: A | Discharge status: Home / Self Care | Payer: Medicare PPO | Attending: Family Medicine | Admitting: Family Medicine

## 2022-07-14 ENCOUNTER — Ambulatory Visit
Admission: RE | Admit: 2022-07-14 | Discharge: 2022-07-14 | Disposition: A | Discharge status: Home / Self Care | Payer: Medicare PPO | Source: Ambulatory Visit | Attending: Family Medicine | Admitting: Family Medicine

## 2022-07-14 DIAGNOSIS — G8929 Other chronic pain: Secondary | ICD-10-CM | POA: Insufficient documentation

## 2022-07-14 DIAGNOSIS — M5136 Other intervertebral disc degeneration, lumbar region: Secondary | ICD-10-CM | POA: Diagnosis not present

## 2022-07-14 DIAGNOSIS — M545 Low back pain, unspecified: Secondary | ICD-10-CM

## 2022-07-15 ENCOUNTER — Ambulatory Visit: Payer: Self-pay | Admitting: *Deleted

## 2022-07-15 NOTE — Patient Outreach (Signed)
  Care Coordination   Follow Up Visit Note   07/15/2022 Name: Erin Patrick MRN: [id number] DOB: [birth year]  Erin Patrick is a 77 y.o. year old female who sees Erin Carrow, DO for primary care. I spoke with  Erin Patrick's daughter by phone today.  What matters to the patients health and wellness today? Erin Patrick support,   Options for Adult Day Programs    Goals Addressed             This Visit's Progress    Long Term Care-Memory Care Placement       Interventions Today    Flowsheet Row Most Recent Value  Chronic Disease   Chronic disease during today's visit Other  [dementia]  General Interventions   General Interventions Discussed/Reviewed General Interventions Reviewed, Level of Care  Level of Care Adult Daycare  [patient and daughter agreeable to [facility], TB test and physcial completed, patient can start once paperwork is received from MD-positive reiforcement provided for active participation]               SDOH assessments and interventions completed:  No     Care Coordination Interventions:  Yes, provided   Follow up plan: No further intervention required.   Encounter Outcome:  Pt. Visit Completed

## 2022-07-15 NOTE — Patient Instructions (Signed)
Visit Information  Thank you for taking time to visit with me today. Please don't hesitate to contact me if I can be of assistance to you.   Following are the goals we discussed today:   Goals Addressed             This Visit's Progress    Long Term Care-Memory Care Placement       Interventions Today    Flowsheet Row Most Recent Value  Chronic Disease   Chronic disease during today's visit Other  [dementia]  General Interventions   General Interventions Discussed/Reviewed General Interventions Reviewed, Level of Care  Level of Care Adult Daycare  [patient and daughter agreeable to [facility], TB test and physcial completed, patient can start once paperwork is received from MD-positive reiforcement provided for active participation]                If you are experiencing a Mental Health or Behavioral Health Crisis or need someone to talk to, please call 911   Patient verbalizes understanding of instructions and care plan provided today and agrees to view in MyChart. Active MyChart status and patient understanding of how to access instructions and care plan via MyChart confirmed with patient.     No further follow up required: patient to start the [facility] once paperwork has been submitted by her provider   Verna Czech, LCSW Clinical Social Worker  [facility] Management [phone]

## 2022-07-20 ENCOUNTER — Encounter: Payer: Self-pay | Admitting: Family Medicine

## 2022-07-20 ENCOUNTER — Ambulatory Visit (INDEPENDENT_AMBULATORY_CARE_PROVIDER_SITE_OTHER): Payer: Medicare PPO | Admitting: Family Medicine

## 2022-07-20 VITALS — BP 106/72 | HR 68 | Temp 97.6°F | Wt 183.4 lb

## 2022-07-20 DIAGNOSIS — M4726 Other spondylosis with radiculopathy, lumbar region: Secondary | ICD-10-CM | POA: Diagnosis not present

## 2022-07-20 NOTE — Patient Instructions (Signed)
Atorvastatin- Bedtime for cholesterol and prevent stroke Aricept- Bedtime for memory Gabapentin- (2x a day medicine) AM and bedtime- back/leg/nerve pain

## 2022-07-20 NOTE — Assessment & Plan Note (Signed)
Will get her restarted on her gabapentin. Recheck 2 months. Call with any concerns.

## 2022-07-20 NOTE — Progress Notes (Signed)
BP 106/72   Pulse 68   Temp 97.6 F (36.4 C) (Oral)   Wt 183 lb 6.4 oz (83.2 kg)   LMP  (LMP Unknown)   SpO2 99%   BMI 33.54 kg/m    Subjective:    Patient ID: Erin Patrick, female    DOB: 07-Apr-1945, 77 y.o.   MRN: [id number]  HPI: Erin Patrick is a 77 y.o. female  Chief Complaint  Patient presents with   Back Pain   BACK PAIN Duration:  chronic Mechanism of injury: unknown Location: bilateral low back Onset: gradual Severity: moderate Quality: aching and shooting Frequency: intermittent Radiation: down both legs Aggravating factors: unknown, bending Alleviating factors: unknown Status: stable Treatments attempted: nothing  Relief with NSAIDs?: No NSAIDs Taken Nighttime pain:  no Paresthesias / decreased sensation:  no Bowel / bladder incontinence:  no Fevers:  no Dysuria / urinary frequency:  no  Relevant past medical, surgical, family and social history reviewed and updated as indicated. Interim medical history since our last visit reviewed. Allergies and medications reviewed and updated.  Review of Systems  Constitutional: Negative.   Respiratory: Negative.    Cardiovascular: Negative.   Gastrointestinal: Negative.   Musculoskeletal:  Positive for back pain and myalgias. Negative for arthralgias, gait problem, joint swelling, neck pain and neck stiffness.  Skin: Negative.   Psychiatric/Behavioral:  Positive for agitation, confusion and dysphoric mood. Negative for behavioral problems, decreased concentration, hallucinations, self-injury, sleep disturbance and suicidal ideas. The patient is nervous/anxious. The patient is not hyperactive.     Per HPI unless specifically indicated above     Objective:    BP 106/72   Pulse 68   Temp 97.6 F (36.4 C) (Oral)   Wt 183 lb 6.4 oz (83.2 kg)   LMP  (LMP Unknown)   SpO2 99%   BMI 33.54 kg/m   Wt Readings from Last 3 Encounters:  07/20/22 183 lb 6.4 oz (83.2 kg)  07/09/22 181 lb 3.2 oz (82.2 kg)   06/29/22 178 lb (80.7 kg)    Physical Exam Vitals and nursing note reviewed.  Constitutional:      General: She is not in acute distress.    Appearance: Normal appearance. She is not ill-appearing, toxic-appearing or diaphoretic.  HENT:     Head: Normocephalic and atraumatic.     Right Ear: External ear normal.     Left Ear: External ear normal.     Nose: Nose normal.     Mouth/Throat:     Mouth: Mucous membranes are moist.     Pharynx: Oropharynx is clear.  Eyes:     General: No scleral icterus.       Right eye: No discharge.        Left eye: No discharge.     Extraocular Movements: Extraocular movements intact.     Conjunctiva/sclera: Conjunctivae normal.     Pupils: Pupils are equal, round, and reactive to light.  Cardiovascular:     Rate and Rhythm: Normal rate and regular rhythm.     Pulses: Normal pulses.     Heart sounds: Normal heart sounds. No murmur heard.    No friction rub. No gallop.  Pulmonary:     Effort: Pulmonary effort is normal. No respiratory distress.     Breath sounds: Normal breath sounds. No stridor. No wheezing, rhonchi or rales.  Chest:     Chest wall: No tenderness.  Musculoskeletal:        General: Normal range of motion.  Cervical back: Normal range of motion and neck supple.  Skin:    General: Skin is warm and dry.     Capillary Refill: Capillary refill takes less than 2 seconds.     Coloration: Skin is not jaundiced or pale.     Findings: No bruising, erythema, lesion or rash.  Neurological:     General: No focal deficit present.     Mental Status: She is alert and oriented to person, place, and time. Mental status is at baseline.  Psychiatric:        Mood and Affect: Mood normal.        Behavior: Behavior normal.        Thought Content: Thought content normal.        Judgment: Judgment normal.     Results for orders placed or performed in visit on 07/09/22  WET PREP FOR TRICH, YEAST, CLUE   Specimen: Urine   Urine  Result  Value Ref Range   Trichomonas Exam Negative Negative   Yeast Exam Negative Negative   Clue Cell Exam Negative Negative  Urinalysis, Routine w reflex microscopic  Result Value Ref Range   Specific Gravity, UA >1.030 (H) 1.005 - 1.030   pH, UA 5.5 5.0 - 7.5   Color, UA Yellow Yellow   Appearance Ur Cloudy (A) Clear   Leukocytes,UA Negative Negative   Protein,UA Negative Negative/Trace   Glucose, UA Negative Negative   Ketones, UA Negative Negative   RBC, UA Negative Negative   Bilirubin, UA Negative Negative   Urobilinogen, Ur 0.2 0.2 - 1.0 mg/dL   Nitrite, UA Negative Negative   Microscopic Examination Comment   Bayer DCA Hb A1c Waived  Result Value Ref Range   HB A1C (BAYER DCA - WAIVED) 6.1 (H) 4.8 - 5.6 %  CBC with Differential/Platelet  Result Value Ref Range   WBC 4.9 3.4 - 10.8 x10E3/uL   RBC 5.03 3.77 - 5.28 x10E6/uL   Hemoglobin 14.1 11.1 - 15.9 g/dL   Hematocrit 13.0 86.5 - 46.6 %   MCV 85 79 - 97 fL   MCH 28.0 26.6 - 33.0 pg   MCHC 33.2 31.5 - 35.7 g/dL   RDW 78.4 69.6 - 29.5 %   Platelets 293 150 - 450 x10E3/uL   Neutrophils 43 Not Estab. %   Lymphs 49 Not Estab. %   Monocytes 6 Not Estab. %   Eos 1 Not Estab. %   Basos 1 Not Estab. %   Neutrophils Absolute 2.1 1.4 - 7.0 x10E3/uL   Lymphocytes Absolute 2.4 0.7 - 3.1 x10E3/uL   Monocytes Absolute 0.3 0.1 - 0.9 x10E3/uL   EOS (ABSOLUTE) 0.1 0.0 - 0.4 x10E3/uL   Basophils Absolute 0.0 0.0 - 0.2 x10E3/uL   Immature Granulocytes 0 Not Estab. %   Immature Grans (Abs) 0.0 0.0 - 0.1 x10E3/uL  Comprehensive metabolic panel  Result Value Ref Range   Glucose 85 70 - 99 mg/dL   BUN 13 8 - 27 mg/dL   Creatinine, Ser 2.84 (H) 0.57 - 1.00 mg/dL   eGFR 53 (L) >13 KG/MWN/0.27   BUN/Creatinine Ratio 12 12 - 28   Sodium 146 (H) 134 - 144 mmol/L   Potassium 4.2 3.5 - 5.2 mmol/L   Chloride 107 (H) 96 - 106 mmol/L   CO2 21 20 - 29 mmol/L   Calcium 9.4 8.7 - 10.3 mg/dL   Total Protein 6.9 6.0 - 8.5 g/dL   Albumin 4.5 3.8  - 4.8 g/dL   Globulin,  Total 2.4 1.5 - 4.5 g/dL   Albumin/Globulin Ratio 1.9 1.2 - 2.2   Bilirubin Total 0.3 0.0 - 1.2 mg/dL   Alkaline Phosphatase 103 44 - 121 IU/L   AST 32 0 - 40 IU/L   ALT 28 0 - 32 IU/L  Lipid Panel w/o Chol/HDL Ratio  Result Value Ref Range   Cholesterol, Total 233 (H) 100 - 199 mg/dL   Triglycerides 54 0 - 149 mg/dL   HDL 69 >10 mg/dL   VLDL Cholesterol Cal 9 5 - 40 mg/dL   LDL Chol Calc (NIH) 272 (H) 0 - 99 mg/dL  QuantiFERON-TB Gold Plus  Result Value Ref Range   QuantiFERON Incubation Incubation performed.    QuantiFERON Criteria Comment    QuantiFERON TB1 Ag Value 0.07 IU/mL   QuantiFERON TB2 Ag Value 0.06 IU/mL   QuantiFERON Nil Value 0.10 IU/mL   QuantiFERON Mitogen Value >10.00 IU/mL   QuantiFERON-TB Gold Plus Negative Negative      Assessment & Plan:   Problem List Items Addressed This Visit       Nervous and Auditory   Osteoarthritis of spine with radiculopathy, lumbar region - Primary    Will get her restarted on her gabapentin. Recheck 2 months. Call with any concerns.         Follow up plan: Return in about 2 months (around 09/19/2022).
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
## 2022-09-10 ENCOUNTER — Ambulatory Visit: Payer: Medicare PPO | Admitting: Psychiatry

## 2022-09-21 ENCOUNTER — Ambulatory Visit: Payer: Medicare PPO | Admitting: Family Medicine

## 2022-09-29 ENCOUNTER — Ambulatory Visit: Payer: Medicare PPO | Admitting: Psychiatry

## 2022-12-03 ENCOUNTER — Ambulatory Visit: Payer: Medicare PPO | Admitting: Family Medicine

## 2022-12-03 ENCOUNTER — Ambulatory Visit: Payer: Self-pay

## 2022-12-03 NOTE — Telephone Encounter (Signed)
  Chief Complaint: abdominal pain Symptoms: abdominal pain in middle, constant Frequency: 1 hr ago Pertinent Negatives: Daughter denies any other sx Disposition: [] ED /[] Urgent Care (no appt availability in office) / [x] Appointment(In office/virtual)/ []  [facility]/ [] Home Care/ [] Refused Recommended Disposition /[] [facility]/ []  Follow-up with PCP Additional Notes: [name], pt daughter was called from Adult day care about pt having abdominal pain. Doesn't know much about the pain besides its been constant. She is wanting pt to be seen so scheduled OV today at 1520 with Rashelle, NP.   Reason for Disposition  [1] MILD-MODERATE pain AND [2] constant AND [3] present > 2 hours  Answer Assessment - Initial Assessment Questions 1. LOCATION: "Where does it hurt?"      In middle  3. ONSET: "When did the pain begin?" (e.g., minutes, hours or days ago)      1 hr ago  5. PATTERN "Does the pain come and go, or is it constant?"    - If it comes and goes: "How long does it last?" "Do you have pain now?"     (Note: Comes and goes means the pain is intermittent. It goes away completely between bouts.)    - If constant: "Is it getting better, staying the same, or getting worse?"      (Note: Constant means the pain never goes away completely; most serious pain is constant and gets worse.)      Constant  6. SEVERITY: "How bad is the pain?"  (e.g., Scale 1-10; mild, moderate, or severe)    - MILD (1-3): Doesn't interfere with normal activities, abdomen soft and not tender to touch.     - MODERATE (4-7): Interferes with normal activities or awakens from sleep, abdomen tender to touch.     - SEVERE (8-10): Excruciating pain, doubled over, unable to do any normal activities.       Unsure  7. RECURRENT SYMPTOM: "Have you ever had this type of stomach pain before?" If Yes, ask: "When was the last time?" and "What happened that time?"      no 10. OTHER SYMPTOMS: "Do you have any other  symptoms?" (e.g., back pain, diarrhea, fever, urination pain, vomiting)       no  Protocols used: Abdominal Pain - Female-A-AH

## 2023-01-13 ENCOUNTER — Telehealth: Payer: Self-pay | Admitting: Family Medicine

## 2023-01-13 NOTE — Telephone Encounter (Signed)
Copied from CRM [id number]. Topic: General - Other >> Jan 12, 2023  4:24 PM Lennox Pippins wrote: Patient's daughter has called back and stated she thought her Medicare Well Visit was supposed to be today 01/12/2023 at 4pm.  I advised her it looks like it is scheduled for 02/02/2023 at 3:45pm but will call at 4pm per the notes.  Please advise if appt is supposed to be today 01/12/2023 or 02/02/2023  [name], daughter, callback [phone]) [phone]

## 2023-01-27 ENCOUNTER — Telehealth: Payer: Self-pay | Admitting: *Deleted

## 2023-01-27 ENCOUNTER — Telehealth: Payer: Self-pay | Admitting: Family Medicine

## 2023-01-27 NOTE — Patient Outreach (Signed)
  Care Coordination   Follow Up Visit Note   01/28/2023 Name: Erin Patrick MRN: [id number] DOB: 04-09-1945  Erin Patrick is a 77 y.o. year old female who sees Dorcas Carrow, DO for primary care. I spoke with  Tana Conch by phone today.  What matters to the patients health and wellness today?  Placement in memory care    Goals Addressed             This Visit's Progress    Long Term Care-Memory Care Placement       Activities and task to complete in order to accomplish goals.   Call or go to Department of Social Services [id number] process for special assistance Medicaid when ready LEVELS OF CARE TASK Review facilities from the list provided call and go visit when indicated            SDOH assessments and interventions completed:  Yes     Care Coordination Interventions:  Yes, provided   Interventions Today    Flowsheet Row Most Recent Value  Chronic Disease   Chronic disease during today's visit --  [email or web address]]  General Interventions   General Interventions Discussed/Reviewed General Interventions Discussed, Level of Care  [pt's daugther dicussed concerns related to patient's dementia symptoms progressing and feels that placement in a memory care center may be evident.]  Level of Care Assisted Living, Personal Care Services, Applications  [provided list of assisted living facilities to daughter to review sent to daugher through email-confirmed that family member has agreed to assist more with pt care-facility placement info to be reviewed in the event placement is needed in the future]  Applications Medicaid  [Medicaid application sent to daughter to completed for special assistance medicaid]  Education Interventions   Education Provided Provided Education  Provided Verbal Education On Other  [placement process and special assistance medicaid eligibility]  Applications Medicaid  [Medicaid application sent to daughter to completed for special assistance  medicaid]       Follow up plan: No further intervention required.  Encounter Outcome:  Patient Visit Completed

## 2023-01-27 NOTE — Patient Outreach (Signed)
  Care Coordination   01/27/2023 Name: Erin Patrick MRN: [id number] DOB: [birth year]   Care Coordination Outreach Attempts:  An unsuccessful telephone outreach was attempted today to offer the patient information about available care coordination services.  Follow Up Plan:  Additional outreach attempts will be made to offer the patient care coordination information and services.   Encounter Outcome:  No Answer   Care Coordination Interventions:  No, not indicated    Peityn Payton, LCSW [place]  [facility], [facility] Licensed Clinical Social Worker Care Coordinator  Direct Dial: [phone]

## 2023-01-27 NOTE — Telephone Encounter (Signed)
Copied from CRM [id number]. Topic: General - Other >> Jan 27, 2023  9:06 AM Marlow Baars wrote: Reason for CRM: The daughter of the patient called in requesting to speak with the patients social worker. Please assist further as she did not list a reason why. She was not in a good mood in was ordering at a drive thru when she called.

## 2023-01-28 NOTE — Patient Instructions (Signed)
Visit Information  Thank you for taking time to visit with me today. Please don't hesitate to contact me if I can be of assistance to you.   Following are the goals we discussed today:   Goals Addressed             This Visit's Progress    Long Term Care-Memory Care Placement       Activities and task to complete in order to accomplish goals.   Call or go to Department of Social Services [id number] process for special assistance Medicaid when ready LEVELS OF CARE TASK Review facilities from the list provided call and go visit when indicated           If you are experiencing a Mental Health or Behavioral Health Crisis or need someone to talk to, please call 911   Patient verbalizes understanding of instructions and care plan provided today and agrees to view in MyChart. Active MyChart status and patient understanding of how to access instructions and care plan via MyChart confirmed with patient.     No further follow up required: patient's daughter to contact this Child psychotherapist w ith any additional placement questions  Bellamia Ferch, [employer] [place]  [facility], [facility] Licensed Clinical Social Geologist, engineering Dial: [phone]

## 2023-02-01 ENCOUNTER — Ambulatory Visit: Payer: Self-pay

## 2023-02-01 NOTE — Telephone Encounter (Signed)
Chief Complaint: Hip pain  Symptoms: right hip pain, burning and itching all over  Frequency: comes and goes Pertinent Negatives: Patient denies fever, nausea, vomiting, chest pain Disposition: [] ED /[] Urgent Care (no appt availability in office) / [x] Appointment(In office/virtual)/ []  [facility]/ [] Home Care/ [] Refused Recommended Disposition /[] [facility]/ []  Follow-up with PCP Additional Notes: Patient daughter Elita Quick called (Signed DPR on file). [name] stated her mom has been experiencing right sided hip pain for about 3 months but now she has a burning feeling all over her body with itching as well. [name] stated she has tried given the pain tylenol for the hip pain and benadryl for the itching but the symptoms are not improving at all.  Care advice was given and [name] was offered an appointment today with PCP but [name] can not bring the patient today. Patient has been scheduled tomorrow for evaluation of the full body burning and itching.  Reason for Disposition  [1] MODERATE pain (e.g., interferes with normal activities, limping) AND [2] present > 3 days  Answer Assessment - Initial Assessment Questions 1. LOCATION and RADIATION: "Where is the pain located?"      Right side  2. QUALITY: "What does the pain feel like?"  (e.g., sharp, dull, aching, burning)     Burning  3. SEVERITY: "How bad is the pain?" "What does it keep you from doing?"   (Scale 1-10; or mild, moderate, severe)   -  MILD (1-3): doesn't interfere with normal activities    -  MODERATE (4-7): interferes with normal activities (e.g., work or school) or awakens from sleep, limping    -  SEVERE (8-10): excruciating pain, unable to do any normal activities, unable to walk     Moderate to severe 4. ONSET: "When did the pain start?" "Does it come and go, or is it there all the time?"     3 months ago  5. WORK OR EXERCISE: "Has there been any recent work or exercise that involved this part of the body?"      No  6.  CAUSE: "What do you think is causing the hip pain?"      I'm not sure  7. AGGRAVATING FACTORS: "What makes the hip pain worse?" (e.g., walking, climbing stairs, running)     No  8. OTHER SYMPTOMS: "Do you have any other symptoms?" (e.g., back pain, pain shooting down leg,  fever, rash)     Burning and itching  Protocols used: Hip Pain-A-AH

## 2023-02-02 ENCOUNTER — Encounter: Payer: Self-pay | Admitting: Nurse Practitioner

## 2023-02-02 ENCOUNTER — Ambulatory Visit (INDEPENDENT_AMBULATORY_CARE_PROVIDER_SITE_OTHER): Payer: Medicare PPO | Admitting: Nurse Practitioner

## 2023-02-02 VITALS — BP 121/85 | HR 102 | Temp 97.9°F | Ht 62.0 in | Wt 188.6 lb

## 2023-02-02 DIAGNOSIS — L299 Pruritus, unspecified: Secondary | ICD-10-CM

## 2023-02-02 DIAGNOSIS — M5431 Sciatica, right side: Secondary | ICD-10-CM

## 2023-02-02 MED ORDER — GABAPENTIN 100 MG PO CAPS: 100.0000 mg | ORAL_CAPSULE | Freq: Two times a day (BID) | ORAL | 1 refills | Status: DC

## 2023-02-02 NOTE — Progress Notes (Signed)
BP 121/85 (BP Location: Left Arm, Patient Position: Sitting, Cuff Size: Normal)   Pulse (!) 102   Temp 97.9 F (36.6 C) (Oral)   Ht 5\' 2"  (1.575 m)   Wt 188 lb 9.6 oz (85.5 kg)   LMP  (LMP Unknown)   SpO2 97%   BMI 34.50 kg/m    Subjective:    Patient ID: Erin Patrick, female    DOB: 1945/12/21, 77 y.o.   MRN: [id number]  HPI: Erin Patrick is a 77 y.o. female  Chief Complaint  Patient presents with   burning/itching    All over body   Hip Pain    Bilateral hips, has had prior surgery years ago, pain is located at the surgical site, often times above or below   Memory Loss    Memory loss has become evident    HIP PAIN Has had prior surgery Duration:  years Involved hip: bilateral  Mechanism of injury: unknown Location: anterior Onset: gradual  Severity: moderate  Quality: sharp and quick Frequency: intermittent Radiation: no Aggravating factors:  sitting down or lying down    Alleviating factors: nothing  Status: worse Treatments attempted: none   Relief with NSAIDs?: No NSAIDs Taken Weakness with weight bearing: no Weakness with walking: no Paresthesias / decreased sensation: no Swelling: no Redness:no Fevers: no  Patient states she has been having itching and burning all over her body.  She has been taking zyrtec which was helpful.  Relevant past medical, surgical, family and social history reviewed and updated as indicated. Interim medical history since our last visit reviewed. Allergies and medications reviewed and updated.  Review of Systems  Musculoskeletal:        Hip pain  Skin:        itching    Per HPI unless specifically indicated above     Objective:    BP 121/85 (BP Location: Left Arm, Patient Position: Sitting, Cuff Size: Normal)   Pulse (!) 102   Temp 97.9 F (36.6 C) (Oral)   Ht 5\' 2"  (1.575 m)   Wt 188 lb 9.6 oz (85.5 kg)   LMP  (LMP Unknown)   SpO2 97%   BMI 34.50 kg/m   Wt Readings from Last 3 Encounters:  02/02/23 188 lb  9.6 oz (85.5 kg)  07/20/22 183 lb 6.4 oz (83.2 kg)  07/09/22 181 lb 3.2 oz (82.2 kg)    Physical Exam Vitals and nursing note reviewed.  Constitutional:      General: She is not in acute distress.    Appearance: Normal appearance. She is normal weight. She is not ill-appearing, toxic-appearing or diaphoretic.  HENT:     Head: Normocephalic.     Right Ear: External ear normal.     Left Ear: External ear normal.     Nose: Nose normal.     Mouth/Throat:     Mouth: Mucous membranes are moist.     Pharynx: Oropharynx is clear.  Eyes:     General:        Right eye: No discharge.        Left eye: No discharge.     Extraocular Movements: Extraocular movements intact.     Conjunctiva/sclera: Conjunctivae normal.     Pupils: Pupils are equal, round, and reactive to light.  Cardiovascular:     Rate and Rhythm: Normal rate and regular rhythm.     Heart sounds: No murmur heard. Pulmonary:     Effort: Pulmonary effort is normal. No respiratory distress.  Breath sounds: Normal breath sounds. No wheezing or rales.  Musculoskeletal:     Cervical back: Normal range of motion and neck supple.  Skin:    General: Skin is warm and dry.     Capillary Refill: Capillary refill takes less than 2 seconds.  Neurological:     General: No focal deficit present.     Mental Status: She is alert and oriented to person, place, and time. Mental status is at baseline.  Psychiatric:        Mood and Affect: Mood normal.        Behavior: Behavior normal.        Thought Content: Thought content normal.        Judgment: Judgment normal.     Results for orders placed or performed in visit on 07/09/22  WET PREP FOR TRICH, YEAST, CLUE   Collection Time: 07/09/22  1:31 PM   Specimen: Urine   Urine  Result Value Ref Range   Trichomonas Exam Negative Negative   Yeast Exam Negative Negative   Clue Cell Exam Negative Negative  Urinalysis, Routine w reflex microscopic   Collection Time: 07/09/22  1:31 PM   Result Value Ref Range   Specific Gravity, UA >1.030 (H) 1.005 - 1.030   pH, UA 5.5 5.0 - 7.5   Color, UA Yellow Yellow   Appearance Ur Cloudy (A) Clear   Leukocytes,UA Negative Negative   Protein,UA Negative Negative/Trace   Glucose, UA Negative Negative   Ketones, UA Negative Negative   RBC, UA Negative Negative   Bilirubin, UA Negative Negative   Urobilinogen, Ur 0.2 0.2 - 1.0 mg/dL   Nitrite, UA Negative Negative   Microscopic Examination Comment   Bayer DCA Hb A1c Waived   Collection Time: 07/09/22  1:48 PM  Result Value Ref Range   HB A1C (BAYER DCA - WAIVED) 6.1 (H) 4.8 - 5.6 %  CBC with Differential/Platelet   Collection Time: 07/09/22  1:52 PM  Result Value Ref Range   WBC 4.9 3.4 - 10.8 x10E3/uL   RBC 5.03 3.77 - 5.28 x10E6/uL   Hemoglobin 14.1 11.1 - 15.9 g/dL   Hematocrit 19.1 47.8 - 46.6 %   MCV 85 79 - 97 fL   MCH 28.0 26.6 - 33.0 pg   MCHC 33.2 31.5 - 35.7 g/dL   RDW 29.5 62.1 - 30.8 %   Platelets 293 150 - 450 x10E3/uL   Neutrophils 43 Not Estab. %   Lymphs 49 Not Estab. %   Monocytes 6 Not Estab. %   Eos 1 Not Estab. %   Basos 1 Not Estab. %   Neutrophils Absolute 2.1 1.4 - 7.0 x10E3/uL   Lymphocytes Absolute 2.4 0.7 - 3.1 x10E3/uL   Monocytes Absolute 0.3 0.1 - 0.9 x10E3/uL   EOS (ABSOLUTE) 0.1 0.0 - 0.4 x10E3/uL   Basophils Absolute 0.0 0.0 - 0.2 x10E3/uL   Immature Granulocytes 0 Not Estab. %   Immature Grans (Abs) 0.0 0.0 - 0.1 x10E3/uL  Comprehensive metabolic panel   Collection Time: 07/09/22  1:52 PM  Result Value Ref Range   Glucose 85 70 - 99 mg/dL   BUN 13 8 - 27 mg/dL   Creatinine, Ser 6.57 (H) 0.57 - 1.00 mg/dL   eGFR 53 (L) >84 ON/GEX/5.28   BUN/Creatinine Ratio 12 12 - 28   Sodium 146 (H) 134 - 144 mmol/L   Potassium 4.2 3.5 - 5.2 mmol/L   Chloride 107 (H) 96 - 106 mmol/L   CO2 21  20 - 29 mmol/L   Calcium 9.4 8.7 - 10.3 mg/dL   Total Protein 6.9 6.0 - 8.5 g/dL   Albumin 4.5 3.8 - 4.8 g/dL   Globulin, Total 2.4 1.5 - 4.5 g/dL    Albumin/Globulin Ratio 1.9 1.2 - 2.2   Bilirubin Total 0.3 0.0 - 1.2 mg/dL   Alkaline Phosphatase 103 44 - 121 IU/L   AST 32 0 - 40 IU/L   ALT 28 0 - 32 IU/L  Lipid Panel w/o Chol/HDL Ratio   Collection Time: 07/09/22  1:52 PM  Result Value Ref Range   Cholesterol, Total 233 (H) 100 - 199 mg/dL   Triglycerides 54 0 - 149 mg/dL   HDL 69 >16 mg/dL   VLDL Cholesterol Cal 9 5 - 40 mg/dL   LDL Chol Calc (NIH) 109 (H) 0 - 99 mg/dL  QuantiFERON-TB Gold Plus   Collection Time: 07/09/22  2:24 PM  Result Value Ref Range   QuantiFERON Incubation Incubation performed.    QuantiFERON Criteria Comment    QuantiFERON TB1 Ag Value 0.07 IU/mL   QuantiFERON TB2 Ag Value 0.06 IU/mL   QuantiFERON Nil Value 0.10 IU/mL   QuantiFERON Mitogen Value >10.00 IU/mL   QuantiFERON-TB Gold Plus Negative Negative      Assessment & Plan:   Problem List Items Addressed This Visit       Nervous and Auditory   Sciatica of right side - Primary   Seems to be flared at this time.  Unsure of whether she is taking Gabapentin but suspect she is out of medication.  Will refill for her today. Follow up in 1 month for reevaluation.       Relevant Medications   gabapentin (NEURONTIN) 100 MG capsule   Other Visit Diagnoses       Itching       continue with zyrtec to hep with symptoms.        Follow up plan: Return in about 1 month (around 03/05/2023) for Routine follow up with Dr. Laural Benes.

## 2023-02-02 NOTE — Assessment & Plan Note (Signed)
Seems to be flared at this time.  Unsure of whether she is taking Gabapentin but suspect she is out of medication.  Will refill for her today. Follow up in 1 month for reevaluation.

## 2023-02-06 DIAGNOSIS — E785 Hyperlipidemia, unspecified: Secondary | ICD-10-CM | POA: Diagnosis not present

## 2023-02-06 DIAGNOSIS — R079 Chest pain, unspecified: Secondary | ICD-10-CM | POA: Diagnosis not present

## 2023-02-06 DIAGNOSIS — M545 Low back pain, unspecified: Secondary | ICD-10-CM | POA: Diagnosis not present

## 2023-02-06 DIAGNOSIS — E119 Type 2 diabetes mellitus without complications: Secondary | ICD-10-CM | POA: Diagnosis not present

## 2023-02-06 DIAGNOSIS — M5137 Other intervertebral disc degeneration, lumbosacral region with discogenic back pain only: Secondary | ICD-10-CM | POA: Diagnosis not present

## 2023-02-06 DIAGNOSIS — G8929 Other chronic pain: Secondary | ICD-10-CM | POA: Diagnosis not present

## 2023-02-06 DIAGNOSIS — M47816 Spondylosis without myelopathy or radiculopathy, lumbar region: Secondary | ICD-10-CM | POA: Diagnosis not present

## 2023-02-06 DIAGNOSIS — R059 Cough, unspecified: Secondary | ICD-10-CM | POA: Diagnosis not present

## 2023-02-06 DIAGNOSIS — R0602 Shortness of breath: Secondary | ICD-10-CM | POA: Diagnosis not present

## 2023-02-06 DIAGNOSIS — F32A Depression, unspecified: Secondary | ICD-10-CM | POA: Diagnosis not present

## 2023-02-06 DIAGNOSIS — G309 Alzheimer's disease, unspecified: Secondary | ICD-10-CM | POA: Diagnosis not present

## 2023-02-06 DIAGNOSIS — R0789 Other chest pain: Secondary | ICD-10-CM | POA: Diagnosis not present

## 2023-02-07 ENCOUNTER — Emergency Department
Admission: EM | Admit: 2023-02-07 | Discharge: 2023-02-07 | Disposition: A | Discharge status: Home / Self Care | Payer: Medicare PPO | Attending: Emergency Medicine | Admitting: Emergency Medicine

## 2023-02-07 ENCOUNTER — Other Ambulatory Visit: Payer: Self-pay

## 2023-02-07 ENCOUNTER — Encounter: Payer: Self-pay | Admitting: Emergency Medicine

## 2023-02-07 DIAGNOSIS — L309 Dermatitis, unspecified: Secondary | ICD-10-CM | POA: Diagnosis not present

## 2023-02-07 DIAGNOSIS — R21 Rash and other nonspecific skin eruption: Secondary | ICD-10-CM | POA: Diagnosis present

## 2023-02-07 DIAGNOSIS — G309 Alzheimer's disease, unspecified: Secondary | ICD-10-CM | POA: Insufficient documentation

## 2023-02-07 DIAGNOSIS — E119 Type 2 diabetes mellitus without complications: Secondary | ICD-10-CM | POA: Diagnosis not present

## 2023-02-07 DIAGNOSIS — F0280 Dementia in other diseases classified elsewhere without behavioral disturbance: Secondary | ICD-10-CM | POA: Insufficient documentation

## 2023-02-07 DIAGNOSIS — L299 Pruritus, unspecified: Secondary | ICD-10-CM | POA: Diagnosis not present

## 2023-02-07 MED ORDER — HYDROCORTISONE 1 % EX CREA: 1.0000 | TOPICAL_CREAM | Freq: Two times a day (BID) | CUTANEOUS | 1 refills | Status: AC

## 2023-02-07 NOTE — ED Notes (Signed)
First Nurse Note: Pt to ED via [facility] from home for Itching for several months. Pt is having burning as well. Pt has seen her PCP and was given gabapentin. VSS with EMS.

## 2023-02-07 NOTE — ED Notes (Signed)
[name] notified that her daughter Erin Patrick is on the way, and that [name] has been updated. Pt. Reminded to stay in room to await ride. Pt. Verbalizes understanding and questions. NAD.

## 2023-02-07 NOTE — ED Notes (Signed)
This RN spoke with pt's daughter, [name], gave discharge instructions and information. Pt's daughter Lamar Laundry is on the way to pick pt. Up, this RN notified [name] to tell Lamar Laundry to come in to ED and pt. Will have discharge completed.

## 2023-02-07 NOTE — ED Provider Notes (Signed)
[facility] Provider Note    Event Date/Time   First MD Initiated Contact with Patient 02/07/23 1150     (approximate)   History   Pruritis   HPI  Erin Patrick is a 77 y.o. female   is brought to the ED via EMS from home with complaint of itching.  Patient states that she has had a rash to her lower back that she has been unable to control and sometimes cannot scratch due to the location that is the end.  Patient also states that she has had an intermittent pain to her anterior chest that has been going on for a couple of years.  She states that it was x-rayed at another hospital but does not recall when that happened.  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  Also history of diabetes type 2, chronic left-sided low back pain, TST, osteoarthritis, major depressive disorder.      Physical Exam   Triage Vital Signs: ED Triage Vitals  Encounter Vitals Group     BP 02/07/23 1123 108/79     Systolic BP Percentile --      Diastolic BP Percentile --      Pulse Rate 02/07/23 1123 86     Resp 02/07/23 1123 18     Temp 02/07/23 1123 98.6 F (37 C)     Temp Source 02/07/23 1123 Oral     SpO2 02/07/23 1123 100 %     Weight 02/07/23 1124 187 lb 6.3 oz (85 kg)     Height 02/07/23 1124 5\' 2"  (1.575 m)     Head Circumference --      Peak Flow --      Pain Score 02/07/23 1124 0     Pain Loc --      Pain Education --      Exclude from Growth Chart --     Most recent vital signs: Vitals:   02/07/23 1123  BP: 108/79  Pulse: 86  Resp: 18  Temp: 98.6 F (37 C)  SpO2: 100%     General: Awake, no distress.  Pleasant, alert, answers simple questions appropriately with the exception of questions related to time. CV:  Good peripheral perfusion.  Heart rate and rate rhythm. Resp:  Normal effort.  Clear bilaterally. Abd:  No distention.  Other:  Right lateral and lower back there patches with very dry skin  with a few excoriations that are suggestive with patient scratching.  Isolated area that appears to be eczema.  No erythema or warmth present.   ED Results / Procedures / Treatments   Labs (all labs ordered are listed, but only abnormal results are displayed) Labs Reviewed - No data to display    PROCEDURES:  Critical Care performed:   Procedures   MEDICATIONS ORDERED IN ED: Medications - No data to display   IMPRESSION / MDM / ASSESSMENT AND PLAN / ED COURSE  I reviewed the triage vital signs and the nursing notes.   Differential diagnosis includes, but is not limited to, pruritus, contact dermatitis, eczema, psoriasis, cellulitis.  77 year old female presents to the ED with complaint of rash that itches to her lower back and side of her torso.  No medications have been used over-the-counter.  Exam appears to be consistent with eczema.  A prescription for hydrocortisone cream was sent to the pharmacy for her to begin using twice daily and to follow-up with her PCP if any  continued problems.      Patient's presentation is most consistent with acute complicated illness / injury requiring diagnostic workup.  FINAL CLINICAL IMPRESSION(S) / ED DIAGNOSES   Final diagnoses:  Eczema, unspecified type     Rx / DC Orders   ED Discharge Orders          Ordered    hydrocortisone cream 1 %  2 times daily        02/07/23 1216             Note:  This document was prepared using Dragon voice recognition software and may include unintentional dictation errors.   Tommi Rumps, PA-C 02/07/23 1314    Minna Antis, MD 02/07/23 1450

## 2023-02-07 NOTE — Discharge Instructions (Signed)
Make an appoint with your primary care provider for follow-up of today's ED visit and also to look at the eczema that you have on your back.  Apply the cream to the area twice a day. Continue with your regular medication.

## 2023-02-07 NOTE — ED Triage Notes (Signed)
Patient to ED via [facility] from home for itching. Pt reports that her back has been itching and unable to scratch it due to the location. Hx was prescribed gabapentin but unsure if she is taking. Hx of dementia- disoriented to time.

## 2023-02-11 ENCOUNTER — Telehealth: Payer: Self-pay | Admitting: *Deleted

## 2023-02-11 NOTE — Patient Instructions (Signed)
Visit Information  Thank you for taking time to visit with me today. Please don't hesitate to contact me if I can be of assistance to you.   Following are the goals we discussed today:   Goals Addressed             This Visit's Progress    Long Term Care-Memory Care Placement       Activities and task to complete in order to accomplish goals.   Call or go to Department of Social Services [id number] process for special assistance Medicaid when ready LEVELS OF CARE TASK Review facilities from the list provided call and schedule a visit when indicated Additional resources to be emailed for facilities in the [place] /[place] area            Our next appointment is by telephone on 02/29/24 at 3pm  Please call the care guide team at [phone] if you need to cancel or reschedule your appointment.   If you are experiencing a Mental Health or Behavioral Health Crisis or need someone to talk to, please call 911   Patient verbalizes understanding of instructions and care plan provided today and agrees to view in MyChart. Active MyChart status and patient understanding of how to access instructions and care plan via MyChart confirmed with patient.     Telephone follow up appointment with care management team member scheduled for: 02/29/24   Verna Czech, LCSW [place]  [facility], [facility] Licensed Clinical Social Worker Care Coordinator  Direct Dial: [phone]

## 2023-02-11 NOTE — Patient Outreach (Signed)
  Care Coordination   Follow Up Visit Note   02/11/2023 Name: Erin Patrick MRN: [id number] DOB: [birth year]  Erin Patrick is a 77 y.o. year old female who sees Erin Carrow, DO for primary care. I spoke with  Erin Patrick's daughter  by phone today.  What matters to the patients health and wellness today?  Patient's daughter interested in memory care placement for patient. Placement process discussed, daughter encouraged to contact facility of choice to schedule tour to discuss details of potential placement.   Goals Addressed             This Visit's Progress    Long Term Care-Memory Care Placement       Activities and task to complete in order to accomplish goals.   Call or go to Department of Social Services [id number] process for special assistance Medicaid when ready LEVELS OF CARE TASK Review facilities from the list provided call and schedule a visit when indicated Additional resources to be emailed for facilities in the [place] /[place] area            SDOH assessments and interventions completed:  No     Care Coordination Interventions:  Yes, provided  Interventions Today    Flowsheet Row Most Recent Value  Chronic Disease   Chronic disease during today's visit Other  [dementia]  General Interventions   General Interventions Discussed/Reviewed General Interventions Reviewed, Level of Care  [patient's daughter discussed need for placement in memory care-requesting assistance with placement options due to lack of assistance with her care needs in the home-discussed option to apply for FMLA]  Level of Care Skilled Nursing Facility  [list of additional facilities to be sent to patient's daughter including the /[place] area]  Education Interventions   Education Provided Provided Education  Provided Verbal Education On Other  [placement process discussed, encouraged daughter to also discuss placement process with admissions directors of prospective facility  of choice for additional details IH:[id number] process]       Follow up plan: Follow up call scheduled for 03/01/23    Encounter Outcome:  Patient Visit Completed

## 2023-02-16 ENCOUNTER — Encounter: Payer: Self-pay | Admitting: *Deleted

## 2023-02-16 ENCOUNTER — Telehealth: Payer: Self-pay | Admitting: Family Medicine

## 2023-02-16 NOTE — Telephone Encounter (Signed)
[facility] reports they received refill. Message left with daughter.

## 2023-02-16 NOTE — Progress Notes (Signed)
 This encounter was created in error - please disregard.

## 2023-02-16 NOTE — Telephone Encounter (Signed)
 Pt's daughter is calling in because pt is supposed to have medication at [facility] and [facility] told them they never received any medication for pt, but chart shows medication has been sent. gabapentin  (NEURONTIN ) 100 MG capsule [[id number]] hydrocortisone  cream 1 % [[id number]]

## 2023-02-18 ENCOUNTER — Ambulatory Visit: Payer: Medicare PPO | Admitting: Emergency Medicine

## 2023-02-18 NOTE — Progress Notes (Signed)
 This encounter was created in error - please disregard.

## 2023-03-01 ENCOUNTER — Ambulatory Visit: Payer: Self-pay | Admitting: *Deleted

## 2023-03-01 NOTE — Patient Outreach (Signed)
  Care Coordination   Follow Up Visit Note   03/01/2023 Name: Erin Patrick MRN: [id number] DOB: January 26, 1946  Erin Patrick is a 78 y.o. year old female who sees Vicci Duwaine SQUIBB, DO for primary care. I spoke with  Raoul Georgi 's daughter Sharlet Collet by phone today.  What matters to the patients health and wellness today?  Caregiver support options. Patient's daughter confirms that patient currently attends [facility] 5x per week. Daughter considering placement in memory care and is reviewing facility list for choice at this time.    Goals Addressed             This Visit's Progress    Long Term Care-Memory Care Placement       Activities and task to complete in order to accomplish goals.   Call or go to Department of Social Services mz:[email or web address] process for special assistance Medicaid when ready LEVELS OF CARE TASK Review facilities from the list provided call and schedule a tour with facility of choice Continue to attend [facility]             SDOH assessments and interventions completed:  No     Care Coordination Interventions:  Yes, provided  Interventions Today    Flowsheet Row Most Recent Value  Chronic Disease   Chronic disease during today's visit Other  [Dementia]  General Interventions   General Interventions Discussed/Reviewed General Interventions Discussed  [reviewed placement needs with patient's daughter]  Level of Care Adult Daycare, Skilled Nursing Facility  [Confirmed that patient attends [facility] days per week for socialization, daughter has received facility list and continues to review placement options]  Education Interventions   Education Provided Provided Education  Provided Verbal Education On Other  [placement process and eligibility-encouraged continued review of placement list for choice as well as  considering scheduling tours to further assist with decision-discussing options with family also  encouraged]  Mental Health Interventions   Mental Health Discussed/Reviewed Other  [emotional support provided to patient's daughter related to caregiver stress and  placement consideration]       Follow up plan: Follow up call scheduled for 03/15/23    Encounter Outcome:  Patient Visit Completed

## 2023-03-01 NOTE — Patient Instructions (Signed)
 Visit Information  Thank you for taking time to visit with me today. Please don't hesitate to contact me if I can be of assistance to you.   Following are the goals we discussed today:   Goals Addressed             This Visit's Progress    Long Term Care-Memory Care Placement       Activities and task to complete in order to accomplish goals.   Call or go to Department of Social Services [email or web address] process for special assistance Medicaid when ready LEVELS OF CARE TASK Review facilities from the list provided call and schedule a tour with facility of choice Continue to attend [facility]             Our next appointment is by telephone on 03/15/23 at 11am  Please call the care guide team at [phone] if you need to cancel or reschedule your appointment.   If you are experiencing a Mental Health or Behavioral Health Crisis or need someone to talk to, please call 911   Patient verbalizes understanding of instructions and care plan provided today and agrees to view in MyChart. Active MyChart status and patient understanding of how to access instructions and care plan via MyChart confirmed with patient.     Telephone follow up appointment with care management team member scheduled for: 03/15/23   Lenn Mean, LCSW [facility], [facility] Licensed Clinical Social Worker Care Coordinator  Direct Dial: [phone]

## 2023-03-05 ENCOUNTER — Ambulatory Visit: Payer: Medicare PPO | Admitting: Family Medicine

## 2023-03-09 ENCOUNTER — Ambulatory Visit: Payer: Medicare PPO | Admitting: Emergency Medicine

## 2023-03-09 VITALS — Ht 63.0 in | Wt 170.0 lb

## 2023-03-09 DIAGNOSIS — Z780 Asymptomatic menopausal state: Secondary | ICD-10-CM

## 2023-03-09 DIAGNOSIS — Z0000 Encounter for general adult medical examination without abnormal findings: Secondary | ICD-10-CM | POA: Diagnosis not present

## 2023-03-09 DIAGNOSIS — E1142 Type 2 diabetes mellitus with diabetic polyneuropathy: Secondary | ICD-10-CM

## 2023-03-09 NOTE — Patient Instructions (Signed)
Ms. Hartzler , Thank you for taking time to come for your Medicare Wellness Visit. I appreciate your ongoing commitment to your health goals. Please review the following plan we discussed and let me know if I can assist you in the future.   Referrals/Orders/Follow-Ups/Clinician Recommendations:  I have placed a referral to diabetic & nutrition education. Someone from their office will be calling to schedule an appointment. I have placed an order for a bone density test. Call [facility] @ [phone] to schedule at your earliest convenience. Needs flu and shingles vaccines. I have requested your most recent eye exam from Dr. Alvester Morin. I have scheduled you an appointment with Dr. Laural Benes for 03/22/23 @ 4:20 pm (arrive 10-15 minutes early)  This is a list of the screening recommended for you and due dates:  Health Maintenance  Topic Date Due   Eye exam for diabetics  Never done   Zoster (Shingles) Vaccine (1 of 2) Never done   DEXA scan (bone density measurement)  Never done   COVID-19 Vaccine (4 - 2024-25 season) 10/18/2022   Yearly kidney health urinalysis for diabetes  12/20/2022   Complete foot exam   12/20/2022   Hemoglobin A1C  01/09/2023   Flu Shot  05/17/2023*   Yearly kidney function blood test for diabetes  07/09/2023   Medicare Annual Wellness Visit  03/08/2024   DTaP/Tdap/Td vaccine (2 - Tdap) 12/20/2031   Pneumonia Vaccine  Completed   Hepatitis C Screening  Completed   HPV Vaccine  Aged Out  *Topic was postponed. The date shown is not the original due date.    Advanced directives: (Copy Requested) Please bring a copy of your health care power of attorney and living will to the office to be added to your chart at your convenience.  Next Medicare Annual Wellness Visit scheduled for next year: Yes, 03/14/24 @ 3:50pm (video visit)
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
## 2023-03-15 ENCOUNTER — Ambulatory Visit: Payer: Self-pay | Admitting: *Deleted
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
## 2023-03-15 NOTE — Patient Instructions (Signed)
Visit Information  Thank you for taking time to visit with me today. Please don't hesitate to contact me if I can be of assistance to you.   Following are the goals we discussed today:   Goals Addressed             This Visit's Progress    Long Term Care-Memory Care Placement       Activities and task to complete in order to accomplish goals.   Call or go to Department of Social Services [id number] process for special assistance Medicaid when ready LEVELS OF CARE TASK Continue to review facilities from the list provided call and schedule a tour with facility of choice Continue to attend [facility]             Our next appointment is by telephone on 04/15/23 at 11am  Please call the care guide team at [phone] if you need to cancel or reschedule your appointment.   If you are experiencing a Mental Health or Behavioral Health Crisis or need someone to talk to, please call 911   Patient verbalizes understanding of instructions and care plan provided today and agrees to view in MyChart. Active MyChart status and patient understanding of how to access instructions and care plan via MyChart confirmed with patient.     Telephone follow up appointment with care management team member scheduled for: 04/15/23   Verna Czech, LCSW [place]  [facility], [facility] Licensed Clinical Social Worker Care Coordinator  Direct Dial: [phone]

## 2023-03-16 ENCOUNTER — Ambulatory Visit: Payer: Self-pay | Admitting: *Deleted

## 2023-03-16 ENCOUNTER — Ambulatory Visit: Payer: Medicare PPO | Admitting: Family Medicine

## 2023-03-16 NOTE — Telephone Encounter (Signed)
Attempted to return her call.   Left a voicemail to call back.

## 2023-03-16 NOTE — Telephone Encounter (Signed)
Patient has appt 03/16/2023 at 3:20 PM

## 2023-03-16 NOTE — Telephone Encounter (Signed)
    Chief Complaint: Daughter reports pt. Was "vomiting at adult day care today and I would like her checked out." Unsure of any other symptoms. No availability today per Laurelyn Sickle in the practice.Asking to be worked in. Symptoms: Above Frequency: Today Pertinent Negatives: Patient denies  Disposition: [] ED /[] Urgent Care (no appt availability in office) / [] Appointment(In office/virtual)/ []  [facility]/ [] Home Care/ [] Refused Recommended Disposition /[] [facility]/ [x]  Follow-up with PCP Additional Notes: Will go to UC for worsening of symptoms, please advise daughter.  Reason for Disposition  [1] MILD or MODERATE vomiting AND [2] present > 48 hours (2 days) (Exception: Mild vomiting with associated diarrhea.)  Answer Assessment - Initial Assessment Questions 1. VOMITING SEVERITY: "How many times have you vomited in the past 24 hours?"     - MILD:  1 - 2 times/day    - MODERATE: 3 - 5 times/day, decreased oral intake without significant weight loss or symptoms of dehydration    - SEVERE: 6 or more times/day, vomits everything or nearly everything, with significant weight loss, symptoms of dehydration      Unsure 2. ONSET: "When did the vomiting begin?"      Today 3. FLUIDS: "What fluids or food have you vomited up today?" "Have you been able to keep any fluids down?"     Unsure 4. ABDOMEN PAIN: "Are your having any abdomen pain?" If Yes : "How bad is it and what does it feel like?" (e.g., crampy, dull, intermittent, constant)      Yes 5. DIARRHEA: "Is there any diarrhea?" If Yes, ask: "How many times today?"      No 6. CONTACTS: "Is there anyone else in the family with the same symptoms?"      No 7. CAUSE: "What do you think is causing your vomiting?"     Unsure 8. HYDRATION STATUS: "Any signs of dehydration?" (e.g., dry mouth [not only dry lips], too weak to stand) "When did you last urinate?"     No 9. OTHER SYMPTOMS: "Do you have any other symptoms?" (e.g.,  fever, headache, vertigo, vomiting blood or coffee grounds, recent head injury)     No 10. PREGNANCY: "Is there any chance you are pregnant?" "When was your last menstrual period?"       No  Protocols used: Vomiting-A-AH

## 2023-03-16 NOTE — Telephone Encounter (Signed)
Message from [place] B sent at 03/16/2023 12:10 PM EST  Summary: vomiting   Pt's daughter called in, says pt has been vomiting          Call History  Contact Date/Time Type Contact Phone/Fax By  03/16/2023 12:10 PM EST Phone (Incoming) [facility] (Emergency Contact) [phone] Judie Petit) Eliseo Gum, Deedra Ehrich

## 2023-03-19 IMAGING — CT CT HEAD W/O CM
4 series · 16 of 47 positions shown, 18 images · non-contrast
Comparison: Brain MRI 09/09/2020

CLINICAL DATA: Left-sided weakness.



[Series 2: head bone · axial · 0.40mm/px · z∈[-126,-98]mm · 3 of 73 slices shown]
[im 8/73  bone]
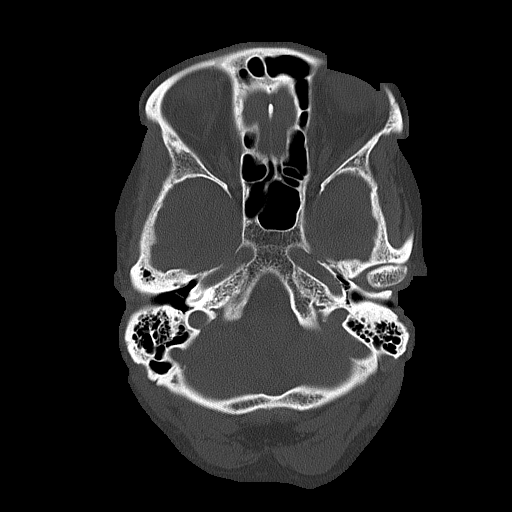
[im 15/73  bone]
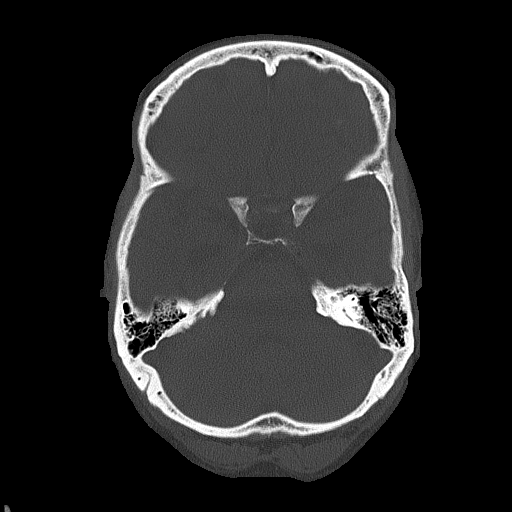
[im 22/73  bone]
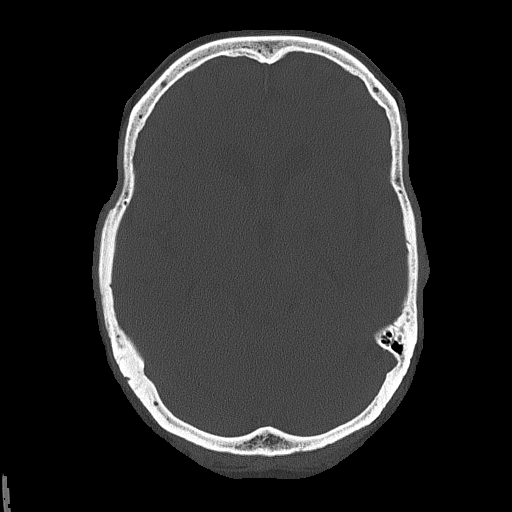

[Series 3: head wo · axial · 0.40mm/px · z∈[-125,-15]mm · 7 of 30 slices shown, 9 images]
[im 4/30  brain]
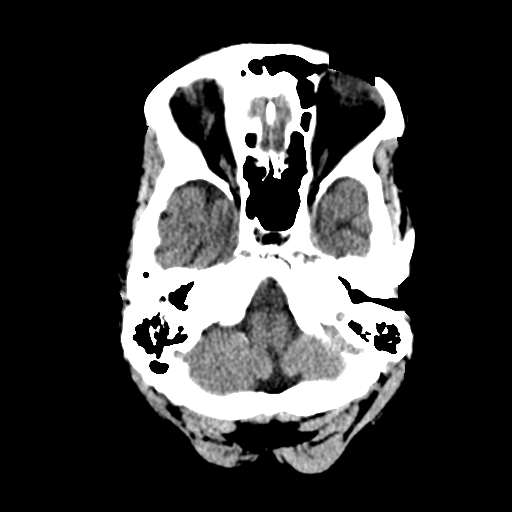
[im 4/30  bone]
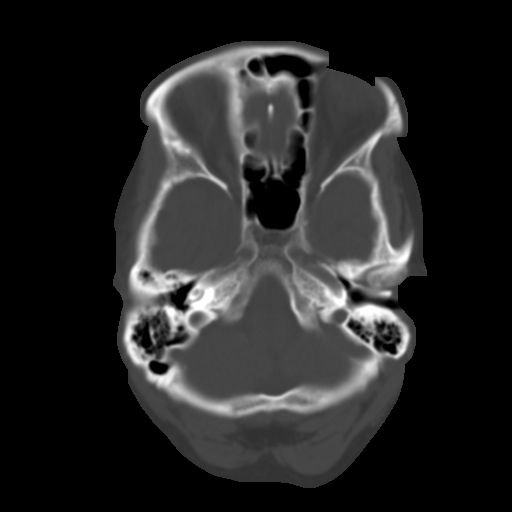
[im 8/30  brain]
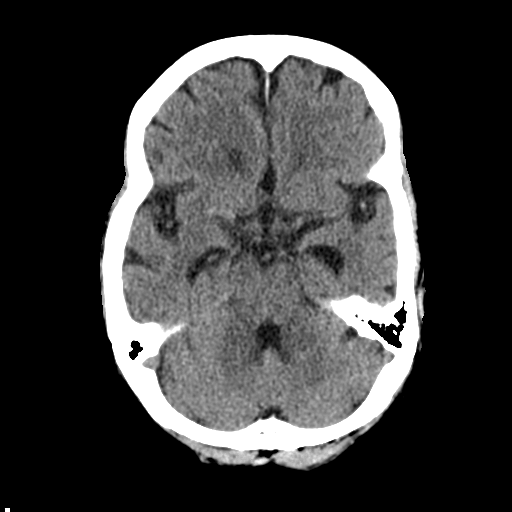
[im 11/30  brain]
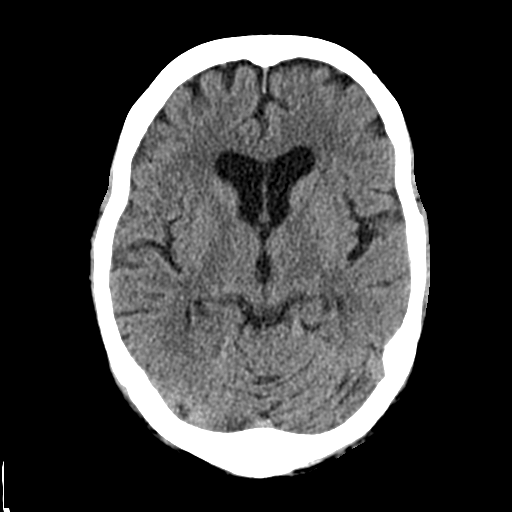
[im 15/30  brain]
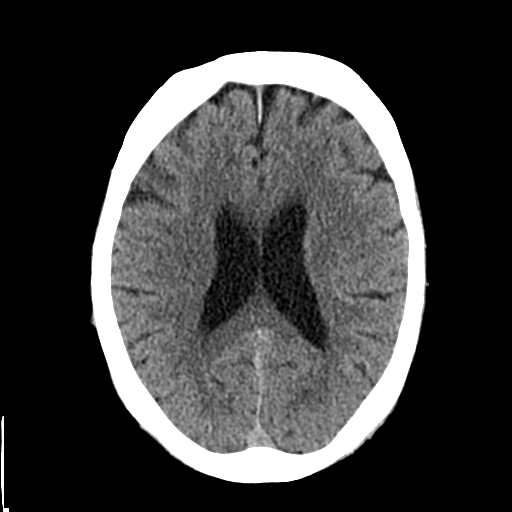
[im 19/30  brain]
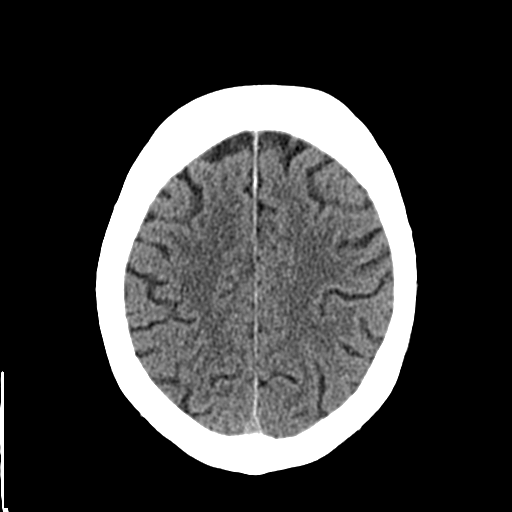
[im 19/30  bone]
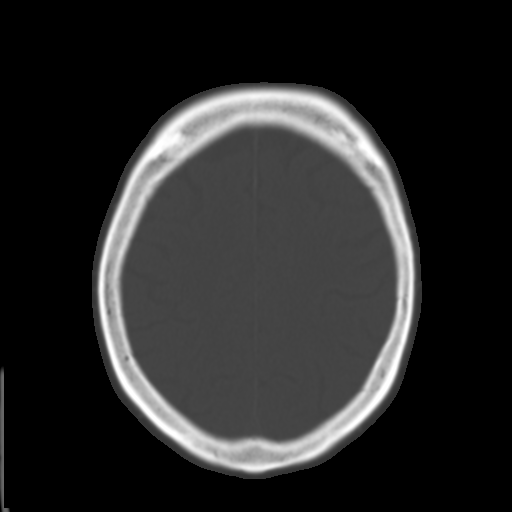
[im 22/30  brain]
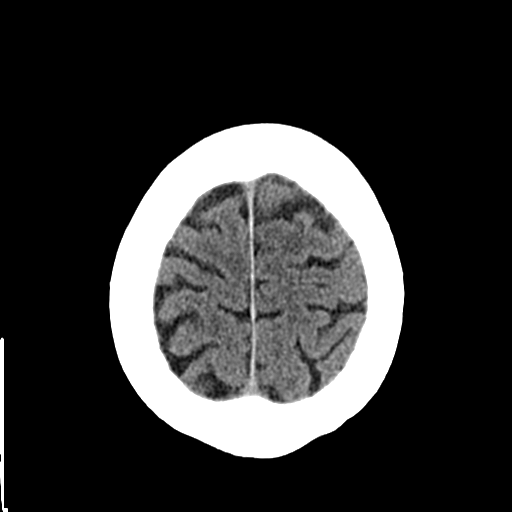
[im 26/30  brain]
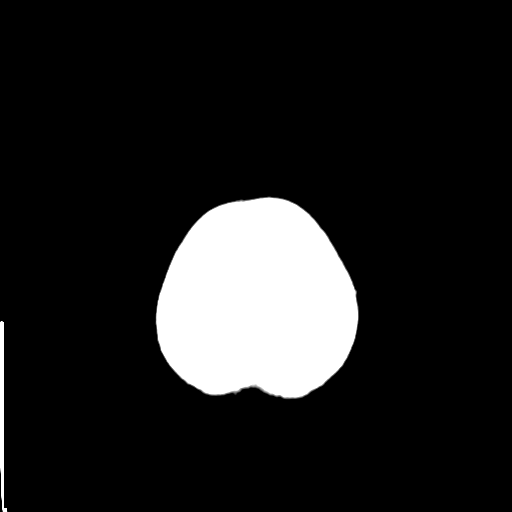

[Series 4: coronal soft tissue · coronal · 0.33mm/px · 3 of 64 slices shown]
[im 22/64  brain]
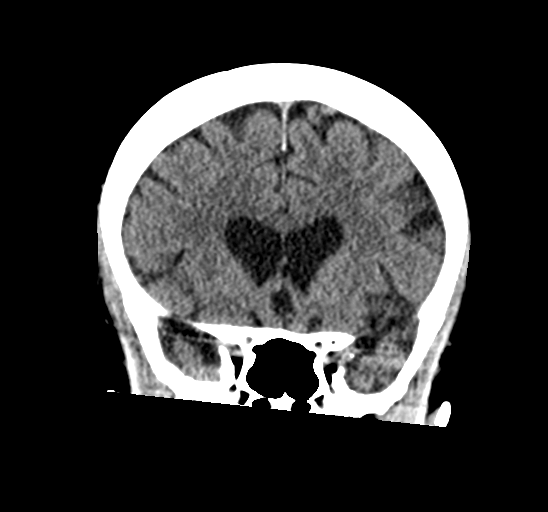
[im 29/64  brain]
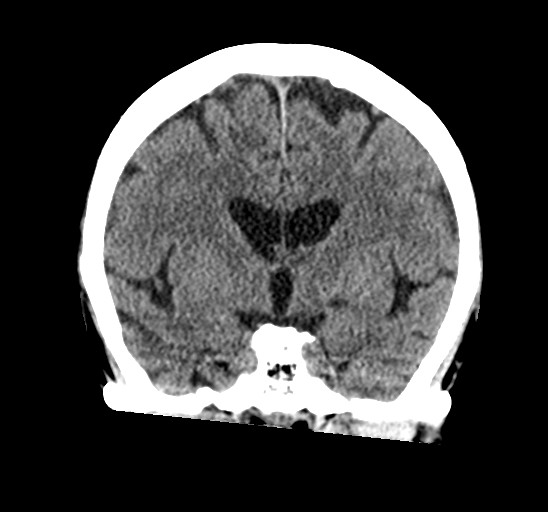
[im 36/64  brain]
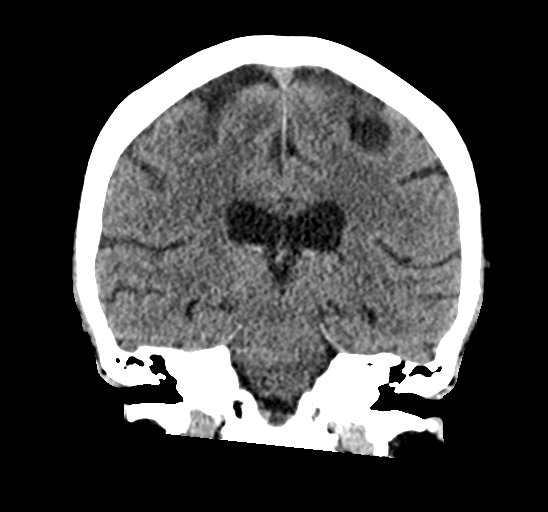

[Series 5: sagittal soft tissue · sagittal · 0.33mm/px · 3 of 53 slices shown]
[im 18/53  brain]
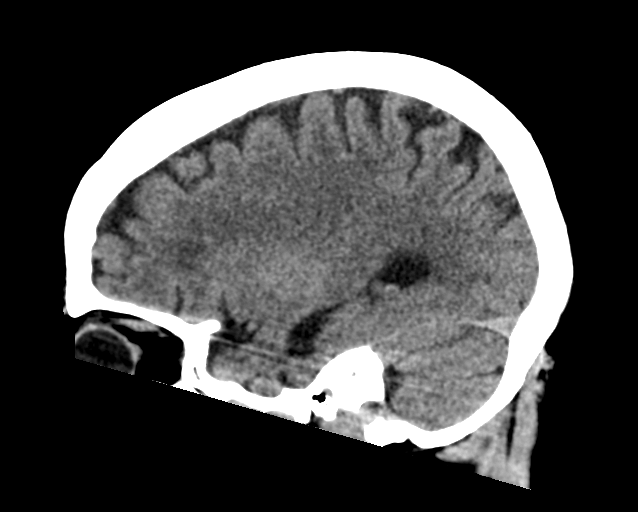
[im 27/53  brain]
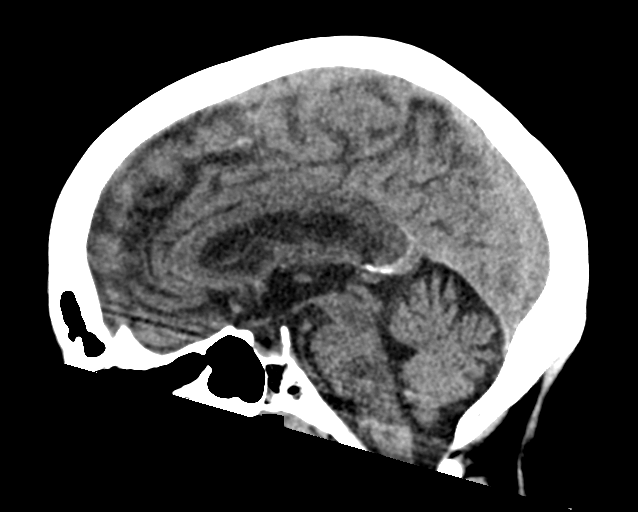
[im 35/53  brain]
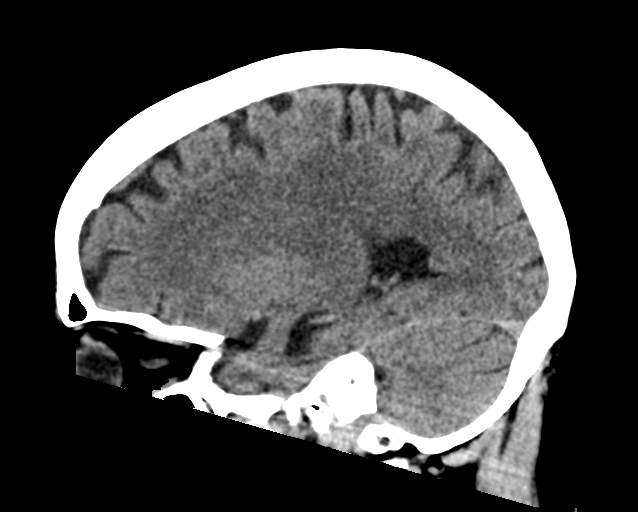

[16 of 47 positions shown; findings below may reference images not displayed]

FINDINGS: Brain: The ventricles are in the midline without mass effect or
shift. They are normal in size and configuration given the patient's
age and degree of cerebral atrophy. No change since prior MRI. No
extra-axial fluid collections are identified. No CT findings to
suggest an acute intracranial process such as hemispheric infarction
or intracranial hemorrhage. No mass lesions are identified. The
brainstem and cerebellum are grossly normal.

Vascular: Scattered vascular calcifications but no aneurysm or
hyperdense vessels.

Skull: No skull fracture or bone lesions.

Sinuses/Orbits: The paranasal sinuses and mastoid air cells are
clear. The globes are intact.

Other: No scalp lesions or scalp hematoma.
IMPRESSION: 1. Age related cerebral atrophy.
2. No acute intracranial findings or mass lesions.

## 2023-03-22 ENCOUNTER — Encounter: Payer: Self-pay | Admitting: Family Medicine

## 2023-03-22 ENCOUNTER — Ambulatory Visit (INDEPENDENT_AMBULATORY_CARE_PROVIDER_SITE_OTHER): Payer: Medicare PPO | Admitting: Family Medicine

## 2023-03-22 VITALS — BP 131/69 | HR 66 | Ht 63.0 in | Wt 184.0 lb

## 2023-03-22 DIAGNOSIS — Z0000 Encounter for general adult medical examination without abnormal findings: Secondary | ICD-10-CM

## 2023-03-22 DIAGNOSIS — F0280 Dementia in other diseases classified elsewhere without behavioral disturbance: Secondary | ICD-10-CM

## 2023-03-22 DIAGNOSIS — E785 Hyperlipidemia, unspecified: Secondary | ICD-10-CM | POA: Diagnosis not present

## 2023-03-22 DIAGNOSIS — F330 Major depressive disorder, recurrent, mild: Secondary | ICD-10-CM

## 2023-03-22 DIAGNOSIS — E1142 Type 2 diabetes mellitus with diabetic polyneuropathy: Secondary | ICD-10-CM

## 2023-03-22 DIAGNOSIS — G309 Alzheimer's disease, unspecified: Secondary | ICD-10-CM | POA: Diagnosis not present

## 2023-03-22 DIAGNOSIS — E1169 Type 2 diabetes mellitus with other specified complication: Secondary | ICD-10-CM | POA: Diagnosis not present
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
## 2023-03-23 LAB — COMPREHENSIVE METABOLIC PANEL
ALT: 23 [IU]/L (ref 0–32)
AST: 29 [IU]/L (ref 0–40)
Albumin: 4.4 g/dL (ref 3.8–4.8)
Alkaline Phosphatase: 104 [IU]/L (ref 44–121)
BUN/Creatinine Ratio: 10 — ABNORMAL LOW (ref 12–28)
BUN: 10 mg/dL (ref 8–27)
Bilirubin Total: 0.2 mg/dL (ref 0.0–1.2)
CO2: 22 mmol/L (ref 20–29)
Calcium: 9.9 mg/dL (ref 8.7–10.3)
Chloride: 105 mmol/L (ref 96–106)
Creatinine, Ser: 1.05 mg/dL — ABNORMAL HIGH (ref 0.57–1.00)
Globulin, Total: 2.3 g/dL (ref 1.5–4.5)
Glucose: 99 mg/dL (ref 70–99)
Potassium: 4.3 mmol/L (ref 3.5–5.2)
Sodium: 143 mmol/L (ref 134–144)
Total Protein: 6.7 g/dL (ref 6.0–8.5)
eGFR: 54 mL/min/{1.73_m2} — ABNORMAL LOW (ref >59)

## 2023-03-23 LAB — CBC WITH DIFFERENTIAL/PLATELET
Basophils Absolute: 0 10*3/uL (ref 0.0–0.2)
Basos: 1 %
EOS (ABSOLUTE): 0.1 10*3/uL (ref 0.0–0.4)
Eos: 2 %
Hematocrit: 43.6 % (ref 34.0–46.6)
Hemoglobin: 13.9 g/dL (ref 11.1–15.9)
Immature Grans (Abs): 0 10*3/uL (ref 0.0–0.1)
Immature Granulocytes: 0 %
Lymphocytes Absolute: 2.6 10*3/uL (ref 0.7–3.1)
Lymphs: 56 %
MCH: 27.8 pg (ref 26.6–33.0)
MCHC: 31.9 g/dL (ref 31.5–35.7)
MCV: 87 fL (ref 79–97)
Monocytes Absolute: 0.4 10*3/uL (ref 0.1–0.9)
Monocytes: 8 %
Neutrophils Absolute: 1.5 10*3/uL (ref 1.4–7.0)
Neutrophils: 33 %
Platelets: 332 10*3/uL (ref 150–450)
RBC: 5 x10E6/uL (ref 3.77–5.28)
RDW: 13.4 % (ref 11.7–15.4)
WBC: 4.6 10*3/uL (ref 3.4–10.8)

## 2023-03-23 LAB — MICROALBUMIN, URINE WAIVED
Creatinine, Urine Waived: 300 mg/dL (ref 10–300)
Microalb, Ur Waived: 80 mg/L — ABNORMAL HIGH (ref 0–19)

## 2023-03-23 LAB — TSH: TSH: 2.02 u[IU]/mL (ref 0.450–4.500)

## 2023-03-23 LAB — LIPID PANEL W/O CHOL/HDL RATIO
Cholesterol, Total: 209 mg/dL — ABNORMAL HIGH (ref 100–199)
HDL: 60 mg/dL (ref >39)
LDL Chol Calc (NIH): 134 mg/dL — ABNORMAL HIGH (ref 0–99)
Triglycerides: 86 mg/dL (ref 0–149)
VLDL Cholesterol Cal: 15 mg/dL (ref 5–40)

## 2023-03-23 LAB — BAYER DCA HB A1C WAIVED: HB A1C (BAYER DCA - WAIVED): 6.1 % — ABNORMAL HIGH (ref 4.8–5.6)

## 2023-03-24 ENCOUNTER — Encounter: Payer: Self-pay | Admitting: Family Medicine

## 2023-03-24 MED ORDER — DONEPEZIL HCL 10 MG PO TABS: 10.0000 mg | ORAL_TABLET | Freq: Every day | ORAL | 1 refills | Status: AC

## 2023-03-24 MED ORDER — GABAPENTIN 100 MG PO CAPS: 100.0000 mg | ORAL_CAPSULE | Freq: Two times a day (BID) | ORAL | 1 refills | Status: DC

## 2023-03-24 MED ORDER — ATORVASTATIN CALCIUM 10 MG PO TABS
ORAL_TABLET | ORAL | 1 refills | Status: AC
Start: 2023-03-24 — End: ?

## 2023-03-24 NOTE — Assessment & Plan Note (Signed)
 A1c stable at 6.1. Continue current regimen. Call with any concerns.

## 2023-03-24 NOTE — Assessment & Plan Note (Signed)
 Under good control on current regimen. Continue current regimen. Continue to monitor. Call with any concerns. Refills given. Labs drawn today.

## 2023-03-24 NOTE — Assessment & Plan Note (Signed)
Stable. Has help at home. Continue to monitor. Call with any concerns.

## 2023-04-12 ENCOUNTER — Ambulatory Visit: Payer: Medicare PPO | Admitting: Dietician

## 2023-04-15 ENCOUNTER — Ambulatory Visit: Payer: Self-pay | Admitting: *Deleted

## 2023-04-15 NOTE — Patient Outreach (Signed)
 Care Coordination   04/15/2023 Name: Erin Patrick MRN: [id number] DOB: April 23, 1945   Care Coordination Outreach Attempts:  An unsuccessful telephone outreach was attempted today to offer the patient information about available complex care management services.  Follow Up Plan:  Additional outreach attempts will be made to offer the patient complex care management information and services.   Encounter Outcome:  No Answer   Care Coordination Interventions:  No, not indicated    Fabyan Loughmiller, LCSW [place]  [facility], [facility] Licensed Clinical Social Worker Care Coordinator  Direct Dial: [phone]

## 2023-04-23 ENCOUNTER — Ambulatory Visit: Payer: Self-pay | Admitting: Family Medicine

## 2023-04-23 ENCOUNTER — Ambulatory Visit: Admitting: Nurse Practitioner

## 2023-04-23 ENCOUNTER — Encounter: Payer: Self-pay | Admitting: Nurse Practitioner

## 2023-04-23 VITALS — BP 138/78 | HR 70 | Temp 97.5°F | Ht 62.5 in | Wt 181.8 lb

## 2023-04-23 DIAGNOSIS — L84 Corns and callosities: Secondary | ICD-10-CM | POA: Diagnosis not present

## 2023-04-23 MED ORDER — DICLOFENAC SODIUM 3 % EX GEL: 1.0000 | Freq: Three times a day (TID) | CUTANEOUS | 0 refills | Status: AC | PRN

## 2023-04-23 NOTE — Assessment & Plan Note (Signed)
 To right 5th toe, very thick.  No tenderness with palpation.  Will get her into podiatry as soon as possible, since diabetic and concern for pressure and skin breakdown.  Will send in Diclofenac gel to apply to area, help with discomfort.  May take Tylenol as needed, no more than 4000 MG daily.  All questions answered.

## 2023-04-23 NOTE — Telephone Encounter (Signed)
 Copied From CRM [id number]. Reason for Triage: pt's daughter called in pt, has swelling of her left pinky toe, also is diabetic needs toe nails clipped   Reason for Disposition  [1] SEVERE pain (e.g., excruciating, unable to do any normal activities) AND [2] not improved after 2 hours of pain medicine  Answer Assessment - Initial Assessment Questions 1. ONSET: "When did the pain start?"      R fifth toe- hurts and swollen 2. LOCATION: "Where is the pain located?"   (e.g., around nail, entire toe, at foot joint)      Whole toe is swollen 3. PAIN: "How bad is the pain?"    (Scale 1-10; or mild, moderate, severe)   -  MILD (1-3): doesn't interfere with normal activities    -  MODERATE (4-7): interferes with normal activities (e.g., work or school) or awakens from sleep, limping    -  SEVERE (8-10): excruciating pain, unable to do any normal activities, unable to walk     9/10, patient is able to walk- does limp at times 4. APPEARANCE: "What does the toe look like?" (e.g., redness, swelling, bruising, pallor)     Redness,swelling 5. CAUSE: "What do you think is causing the toe pain?"     Not sure  Protocols used: Toe Pain-A-AH

## 2023-04-23 NOTE — Patient Instructions (Signed)
 Corns and Calluses: What to Know     Corns and calluses are skin conditions that can cause discomfort. Corns are small areas of thickened skin that usually form on a toe. They have a cone-shaped core that can press on nerves, causing pain. Calluses are larger areas of thickened skin that can form anywhere on the body. They often show up on the hands, soles of the feet, and heels. Calluses don't have a pointy core like corns do. What are the causes? Corns and calluses are caused by rubbing or pressure. Tight or bad-fitting shoes are often what cause these things. What increases the risk? Corns Corns are more likely to develop in people who have misshapen toes, such as hammertoes. Calluses Calluses are more likely to develop in people who: Work with their hands. Wear shoes that fit poorly, are too tight, or have high heels. Have misshapen toes. Some conditions make you more likely to get calluses and can lead to problems like ulceration or infection. These include: Diabetes. Poor blood flow. Numbness in your feet. What are the signs or symptoms? Symptoms of a corn or callus include: A hard growth on the skin. Pain or tenderness under the skin. Redness and swelling. More discomfort when wearing tight shoes, if your feet are affected. If a corn or callus becomes infected, symptoms may include: Redness and swelling that gets worse. Pain. Fluid, blood, or pus coming out of the corn or callus. How is this diagnosed? Corns and calluses may be diagnosed based on your: Symptoms. Medical history. Physical exam. How is this treated? Treatment for corns and calluses may include: Fixing the cause, like changing shoes, wearing gloves, or using shoe inserts or protective pads. Applying lotion to soften the skin. This may include using a medicated moisturizer. Gently removing dead layers of skin. Removing the corn or callus with a scalpel or laser. This is done by a health care provider who  specializes in skin or feet (dermatologist or podiatrist). Taking antibiotics, if it's infected. Having surgery for a misshapen toe. Follow these instructions at home:  Take your medicine only as told. If you were given antibiotics, take them as told. Do not stop taking them even if you start to feel better. Wear shoes that fit well. Avoid wearing shoes that have high heels or are too tight or too loose. Wear padding, protective layers, gloves, or orthotics as told. Soak your hands or feet and file the corn or callus with a file or pumice stone. Do this as told by your provider. Check your corn or callus every day for signs of infection. Contact a health care provider if: Your symptoms don't get better with treatment. You have redness or swelling that gets worse. Your corn or callus is painful. You have fluid, blood, or pus coming from your corn or callus. You have new symptoms. Get help right away if: You have very bad pain with redness. This information is not intended to replace advice given to you by your health care provider. Make sure you discuss any questions you have with your health care provider. Document Revised: 07/30/2022 Document Reviewed: 07/30/2022 Elsevier Patient Education  2024 [employer].

## 2023-04-23 NOTE — Telephone Encounter (Signed)
  Chief Complaint: toe pain and swelling Symptoms: patient's daughter is calling to report patient has swelling fifth toe of R foot  Pertinent Negatives: Patient denies fever, bruising  Disposition: [] ED /[] Urgent Care (no appt availability in office) / [x] Appointment(In office/virtual)/ []  [facility]/ [] Home Care/ [] Refused Recommended Disposition /[] [facility]/ []  Follow-up with PCP Additional Notes: Appointment scheduled for evaluation of toe and possible referral to podiatry for nails

## 2023-04-23 NOTE — Progress Notes (Signed)
 BP 138/78   Pulse 70   Temp (!) 97.5 F (36.4 C) (Oral)   Ht 5' 2.5" (1.588 m)   Wt 181 lb 12.8 oz (82.5 kg)   LMP  (LMP Unknown)   SpO2 98%   BMI 32.72 kg/m    Subjective:    Patient ID: Erin Patrick, female    DOB: [birth year], 78 y.o.   MRN: [id number]  HPI: Tabbetha Kutscher is a 78 y.o. female  Chief Complaint  Patient presents with   Toe Pain    Patient states her pinky toe on her R foot has been hurting. States it has been broken before.    Daughter at bedside with patient.  TOE PAIN Her 5th toe to right foot has been hurting. She thinks she had surgery on it 10 years ago.  Reports the pain is getting bad and hurts when things touch it.  This has been off and on for "a long time". She does not remember hurting the toe. She has never had gout before, there is family history. Duration: months Involved toe: rightpinky toe  Mechanism of injury: unknown Onset: gradual Severity: 10/10  Quality: sharp, shooting, stabbing, tender, and throbbing Frequency: intermittent Radiation: no Aggravating factors: weight bearing and walking  Alleviating factors: nothing  Status: fluctuating Treatments attempted: Tylenol  Relief with NSAIDs?: No NSAIDs Taken Morning stiffness: no Redness: no  Bruising: no Swelling: yes -- this will come and go Paresthesias / decreased sensation: no Fevers: no   Relevant past medical, surgical, family and social history reviewed and updated as indicated. Interim medical history since our last visit reviewed. Allergies and medications reviewed and updated.  Review of Systems  Constitutional:  Negative for activity change, appetite change, diaphoresis, fatigue and fever.  Respiratory:  Negative for cough, chest tightness, shortness of breath and wheezing.   Cardiovascular:  Negative for chest pain, palpitations and leg swelling.  Endocrine: Negative for cold intolerance, heat intolerance, polydipsia, polyphagia and polyuria.  Musculoskeletal:   Positive for arthralgias.  Neurological: Negative.   Psychiatric/Behavioral: Negative.      Per HPI unless specifically indicated above     Objective:    BP 138/78   Pulse 70   Temp (!) 97.5 F (36.4 C) (Oral)   Ht 5' 2.5" (1.588 m)   Wt 181 lb 12.8 oz (82.5 kg)   LMP  (LMP Unknown)   SpO2 98%   BMI 32.72 kg/m   Wt Readings from Last 3 Encounters:  04/23/23 181 lb 12.8 oz (82.5 kg)  03/22/23 184 lb (83.5 kg)  03/09/23 170 lb (77.1 kg)    Physical Exam Vitals and nursing note reviewed.  Constitutional:      General: She is awake. She is not in acute distress.    Appearance: She is well-developed and well-groomed. She is obese. She is not ill-appearing or toxic-appearing.  HENT:     Head: Normocephalic.     Right Ear: Hearing and external ear normal.     Left Ear: Hearing and external ear normal.  Eyes:     General: Lids are normal.        Right eye: No discharge.        Left eye: No discharge.     Conjunctiva/sclera: Conjunctivae normal.     Pupils: Pupils are equal, round, and reactive to light.  Neck:     Thyroid: No thyromegaly.     Vascular: No carotid bruit.  Cardiovascular:     Rate and Rhythm: Normal rate  and regular rhythm.     Pulses:          Dorsalis pedis pulses are 2+ on the right side and 2+ on the left side.       Posterior tibial pulses are 2+ on the right side and 2+ on the left side.     Heart sounds: Normal heart sounds. No murmur heard.    No gallop.  Pulmonary:     Effort: Pulmonary effort is normal. No accessory muscle usage or respiratory distress.     Breath sounds: Normal breath sounds.  Abdominal:     General: Bowel sounds are normal. There is no distension.     Palpations: Abdomen is soft.     Tenderness: There is no abdominal tenderness.  Musculoskeletal:     Cervical back: Normal range of motion and neck supple.     Right lower leg: No edema.     Left lower leg: No edema.     Right foot: Normal range of motion.     Left foot:  Normal range of motion.  Feet:     Right foot:     Skin integrity: Callus (thick callus to right 5th toe) and dry skin present. No ulcer, blister, skin breakdown, erythema or warmth.     Toenail Condition: Right toenails are abnormally thick.     Left foot:     Skin integrity: Dry skin present. No callus.     Toenail Condition: Left toenails are abnormally thick.  Lymphadenopathy:     Cervical: No cervical adenopathy.  Skin:    General: Skin is warm and dry.  Neurological:     Mental Status: She is alert and oriented to person, place, and time.     Deep Tendon Reflexes: Reflexes are normal and symmetric.     Reflex Scores:      Brachioradialis reflexes are 2+ on the right side and 2+ on the left side.      Patellar reflexes are 2+ on the right side and 2+ on the left side. Psychiatric:        Attention and Perception: Attention normal.        Mood and Affect: Mood normal.        Speech: Speech normal.        Behavior: Behavior normal. Behavior is cooperative.        Thought Content: Thought content normal.     Results for orders placed or performed in visit on 03/22/23  Microalbumin, Urine Waived   Collection Time: 03/22/23  4:25 PM  Result Value Ref Range   Microalb, Ur Waived 80 (H) 0 - 19 mg/L   Creatinine, Urine Waived 300 10 - 300 mg/dL   Microalb/Creat Ratio 30-300 (H) <30 mg/g  Bayer DCA Hb A1c Waived   Collection Time: 03/22/23  4:25 PM  Result Value Ref Range   HB A1C (BAYER DCA - WAIVED) 6.1 (H) 4.8 - 5.6 %  CBC with Differential/Platelet   Collection Time: 03/22/23  4:28 PM  Result Value Ref Range   WBC 4.6 3.4 - 10.8 x10E3/uL   RBC 5.00 3.77 - 5.28 x10E6/uL   Hemoglobin 13.9 11.1 - 15.9 g/dL   Hematocrit 16.1 09.6 - 46.6 %   MCV 87 79 - 97 fL   MCH 27.8 26.6 - 33.0 pg   MCHC 31.9 31.5 - 35.7 g/dL   RDW 04.5 40.9 - 81.1 %   Platelets 332 150 - 450 x10E3/uL   Neutrophils 33 Not Estab. %  Lymphs 56 Not Estab. %   Monocytes 8 Not Estab. %   Eos 2 Not  Estab. %   Basos 1 Not Estab. %   Neutrophils Absolute 1.5 1.4 - 7.0 x10E3/uL   Lymphocytes Absolute 2.6 0.7 - 3.1 x10E3/uL   Monocytes Absolute 0.4 0.1 - 0.9 x10E3/uL   EOS (ABSOLUTE) 0.1 0.0 - 0.4 x10E3/uL   Basophils Absolute 0.0 0.0 - 0.2 x10E3/uL   Immature Granulocytes 0 Not Estab. %   Immature Grans (Abs) 0.0 0.0 - 0.1 x10E3/uL  Comprehensive metabolic panel   Collection Time: 03/22/23  4:28 PM  Result Value Ref Range   Glucose 99 70 - 99 mg/dL   BUN 10 8 - 27 mg/dL   Creatinine, Ser 5.40 (H) 0.57 - 1.00 mg/dL   eGFR 54 (L) >98 JX/BJY/7.82   BUN/Creatinine Ratio 10 (L) 12 - 28   Sodium 143 134 - 144 mmol/L   Potassium 4.3 3.5 - 5.2 mmol/L   Chloride 105 96 - 106 mmol/L   CO2 22 20 - 29 mmol/L   Calcium 9.9 8.7 - 10.3 mg/dL   Total Protein 6.7 6.0 - 8.5 g/dL   Albumin 4.4 3.8 - 4.8 g/dL   Globulin, Total 2.3 1.5 - 4.5 g/dL   Bilirubin Total 0.2 0.0 - 1.2 mg/dL   Alkaline Phosphatase 104 44 - 121 IU/L   AST 29 0 - 40 IU/L   ALT 23 0 - 32 IU/L  Lipid Panel w/o Chol/HDL Ratio   Collection Time: 03/22/23  4:28 PM  Result Value Ref Range   Cholesterol, Total 209 (H) 100 - 199 mg/dL   Triglycerides 86 0 - 149 mg/dL   HDL 60 >95 mg/dL   VLDL Cholesterol Cal 15 5 - 40 mg/dL   LDL Chol Calc (NIH) 621 (H) 0 - 99 mg/dL  TSH   Collection Time: 03/22/23  4:28 PM  Result Value Ref Range   TSH 2.020 0.450 - 4.500 uIU/mL      Assessment & Plan:   Problem List Items Addressed This Visit       Musculoskeletal and Integument   Callus of toe - Primary   To right 5th toe, very thick.  No tenderness with palpation.  Will get her into podiatry as soon as possible, since diabetic and concern for pressure and skin breakdown.  Will send in Diclofenac gel to apply to area, help with discomfort.  May take Tylenol as needed, no more than 4000 MG daily.  All questions answered.      Relevant Orders   Ambulatory referral to Podiatry     Follow up plan: Return if symptoms worsen or  fail to improve.

## 2023-05-28 ENCOUNTER — Ambulatory Visit: Admitting: Pediatrics

## 2023-06-01 ENCOUNTER — Ambulatory Visit: Admitting: Family Medicine

## 2023-06-01 ENCOUNTER — Encounter: Payer: Self-pay | Admitting: Family Medicine

## 2023-06-01 VITALS — BP 132/72 | HR 71 | Temp 98.4°F | Resp 16 | Ht 62.52 in | Wt 186.6 lb

## 2023-06-01 DIAGNOSIS — R053 Chronic cough: Secondary | ICD-10-CM | POA: Diagnosis not present

## 2023-06-01 MED ORDER — SPACER/AERO-HOLDING CHAMBERS DEVI: 0 refills | Status: AC

## 2023-06-01 MED ORDER — ALBUTEROL SULFATE HFA 108 (90 BASE) MCG/ACT IN AERS: 2.0000 | INHALATION_SPRAY | Freq: Four times a day (QID) | RESPIRATORY_TRACT | 0 refills | Status: AC | PRN

## 2023-06-01 NOTE — Progress Notes (Signed)
 BP 132/72 (BP Location: Left Arm, Patient Position: Sitting, Cuff Size: Normal)   Pulse 71   Temp 98.4 F (36.9 C) (Oral)   Resp 16   Ht 5' 2.52" (1.588 m)   Wt 186 lb 9.6 oz (84.6 kg)   LMP  (LMP Unknown)   SpO2 98%   BMI 33.56 kg/m    Subjective:    Patient ID: Erin Patrick, female    DOB: 1945/03/16, 78 y.o.   MRN: [id number]  HPI: Erin Patrick is a 78 y.o. female  Chief Complaint  Patient presents with   Cough    Comes and goes.   COUGH Duration: chronic Circumstances of initial development of cough: unknown Cough severity: moderate Cough description: dry Aggravating factors:  nothing Alleviating factors: nothing Status:  stable Treatments attempted: none Wheezing: no Shortness of breath: no Chest pain: no Chest tightness:yes Nasal congestion: yes Runny nose: yes Postnasal drip: no Frequent throat clearing or swallowing: yes Hemoptysis: no Fevers: no Night sweats: no Weight loss: no Heartburn: no Recent foreign travel: no Tuberculosis contacts: no  Relevant past medical, surgical, family and social history reviewed and updated as indicated. Interim medical history since our last visit reviewed. Allergies and medications reviewed and updated.  Review of Systems  Constitutional: Negative.   HENT: Negative.    Respiratory:  Positive for cough. Negative for apnea, choking, chest tightness, shortness of breath, wheezing and stridor.   Cardiovascular: Negative.   Gastrointestinal: Negative.   Musculoskeletal: Negative.   Psychiatric/Behavioral: Negative.      Per HPI unless specifically indicated above     Objective:    BP 132/72 (BP Location: Left Arm, Patient Position: Sitting, Cuff Size: Normal)   Pulse 71   Temp 98.4 F (36.9 C) (Oral)   Resp 16   Ht 5' 2.52" (1.588 m)   Wt 186 lb 9.6 oz (84.6 kg)   LMP  (LMP Unknown)   SpO2 98%   BMI 33.56 kg/m   Wt Readings from Last 3 Encounters:  06/01/23 186 lb 9.6 oz (84.6 kg)  04/23/23 181 lb  12.8 oz (82.5 kg)  03/22/23 184 lb (83.5 kg)    Physical Exam Vitals and nursing note reviewed.  Constitutional:      General: She is not in acute distress.    Appearance: Normal appearance. She is not ill-appearing, toxic-appearing or diaphoretic.  HENT:     Head: Normocephalic and atraumatic.     Right Ear: External ear normal.     Left Ear: External ear normal.     Nose: Nose normal.     Mouth/Throat:     Mouth: Mucous membranes are moist.     Pharynx: Oropharynx is clear.  Eyes:     General: No scleral icterus.       Right eye: No discharge.        Left eye: No discharge.     Extraocular Movements: Extraocular movements intact.     Conjunctiva/sclera: Conjunctivae normal.     Pupils: Pupils are equal, round, and reactive to light.  Cardiovascular:     Rate and Rhythm: Normal rate and regular rhythm.     Pulses: Normal pulses.     Heart sounds: Normal heart sounds. No murmur heard.    No friction rub. No gallop.  Pulmonary:     Effort: Pulmonary effort is normal. No respiratory distress.     Breath sounds: Normal breath sounds. No stridor. No wheezing, rhonchi or rales.  Chest:     Chest  wall: No tenderness.  Musculoskeletal:        General: Normal range of motion.     Cervical back: Normal range of motion and neck supple.  Skin:    General: Skin is warm and dry.     Capillary Refill: Capillary refill takes less than 2 seconds.     Coloration: Skin is not jaundiced or pale.     Findings: No bruising, erythema, lesion or rash.  Neurological:     General: No focal deficit present.     Mental Status: She is alert and oriented to person, place, and time. Mental status is at baseline.  Psychiatric:        Mood and Affect: Mood normal.        Behavior: Behavior normal.        Thought Content: Thought content normal.        Judgment: Judgment normal.     Results for orders placed or performed in visit on 03/22/23  Microalbumin, Urine Waived   Collection Time:  03/22/23  4:25 PM  Result Value Ref Range   Microalb, Ur Waived 80 (H) 0 - 19 mg/L   Creatinine, Urine Waived 300 10 - 300 mg/dL   Microalb/Creat Ratio 30-300 (H) <30 mg/g  Bayer DCA Hb A1c Waived   Collection Time: 03/22/23  4:25 PM  Result Value Ref Range   HB A1C (BAYER DCA - WAIVED) 6.1 (H) 4.8 - 5.6 %  CBC with Differential/Platelet   Collection Time: 03/22/23  4:28 PM  Result Value Ref Range   WBC 4.6 3.4 - 10.8 x10E3/uL   RBC 5.00 3.77 - 5.28 x10E6/uL   Hemoglobin 13.9 11.1 - 15.9 g/dL   Hematocrit 30.8 65.7 - 46.6 %   MCV 87 79 - 97 fL   MCH 27.8 26.6 - 33.0 pg   MCHC 31.9 31.5 - 35.7 g/dL   RDW 84.6 96.2 - 95.2 %   Platelets 332 150 - 450 x10E3/uL   Neutrophils 33 Not Estab. %   Lymphs 56 Not Estab. %   Monocytes 8 Not Estab. %   Eos 2 Not Estab. %   Basos 1 Not Estab. %   Neutrophils Absolute 1.5 1.4 - 7.0 x10E3/uL   Lymphocytes Absolute 2.6 0.7 - 3.1 x10E3/uL   Monocytes Absolute 0.4 0.1 - 0.9 x10E3/uL   EOS (ABSOLUTE) 0.1 0.0 - 0.4 x10E3/uL   Basophils Absolute 0.0 0.0 - 0.2 x10E3/uL   Immature Granulocytes 0 Not Estab. %   Immature Grans (Abs) 0.0 0.0 - 0.1 x10E3/uL  Comprehensive metabolic panel   Collection Time: 03/22/23  4:28 PM  Result Value Ref Range   Glucose 99 70 - 99 mg/dL   BUN 10 8 - 27 mg/dL   Creatinine, Ser 8.41 (H) 0.57 - 1.00 mg/dL   eGFR 54 (L) >32 GM/WNU/2.72   BUN/Creatinine Ratio 10 (L) 12 - 28   Sodium 143 134 - 144 mmol/L   Potassium 4.3 3.5 - 5.2 mmol/L   Chloride 105 96 - 106 mmol/L   CO2 22 20 - 29 mmol/L   Calcium 9.9 8.7 - 10.3 mg/dL   Total Protein 6.7 6.0 - 8.5 g/dL   Albumin 4.4 3.8 - 4.8 g/dL   Globulin, Total 2.3 1.5 - 4.5 g/dL   Bilirubin Total 0.2 0.0 - 1.2 mg/dL   Alkaline Phosphatase 104 44 - 121 IU/L   AST 29 0 - 40 IU/L   ALT 23 0 - 32 IU/L  Lipid Panel w/o Chol/HDL Ratio  Collection Time: 03/22/23  4:28 PM  Result Value Ref Range   Cholesterol, Total 209 (H) 100 - 199 mg/dL   Triglycerides 86 0 - 149 mg/dL    HDL 60 >47 mg/dL   VLDL Cholesterol Cal 15 5 - 40 mg/dL   LDL Chol Calc (NIH) 425 (H) 0 - 99 mg/dL  TSH   Collection Time: 03/22/23  4:28 PM  Result Value Ref Range   TSH 2.020 0.450 - 4.500 uIU/mL      Assessment & Plan:   Problem List Items Addressed This Visit   None Visit Diagnoses       Chronic cough    -  Primary   Spiro normal. Will start albuterol and recheck in 1 month. Call with any concerns.   Relevant Orders   Spirometry with graph (Completed)        Follow up plan: Return for As scheduled.

## 2023-06-24 ENCOUNTER — Emergency Department
Admission: EM | Admit: 2023-06-24 | Discharge: 2023-06-24 | Disposition: A | Discharge status: Home / Self Care | Attending: Emergency Medicine | Admitting: Emergency Medicine

## 2023-06-24 ENCOUNTER — Other Ambulatory Visit: Payer: Self-pay

## 2023-06-24 ENCOUNTER — Emergency Department

## 2023-06-24 DIAGNOSIS — R0789 Other chest pain: Secondary | ICD-10-CM | POA: Diagnosis present

## 2023-06-24 DIAGNOSIS — F0390 Unspecified dementia without behavioral disturbance: Secondary | ICD-10-CM | POA: Diagnosis not present

## 2023-06-24 DIAGNOSIS — R079 Chest pain, unspecified: Secondary | ICD-10-CM

## 2023-06-24 LAB — CBC
HCT: 44.9 % (ref 36.0–46.0)
Hemoglobin: 14.2 g/dL (ref 12.0–15.0)
MCH: 27.7 pg (ref 26.0–34.0)
MCHC: 31.6 g/dL (ref 30.0–36.0)
MCV: 87.7 fL (ref 80.0–100.0)
Platelets: 304 10*3/uL (ref 150–400)
RBC: 5.12 MIL/uL — ABNORMAL HIGH (ref 3.87–5.11)
RDW: 13.7 % (ref 11.5–15.5)
WBC: 4.9 10*3/uL (ref 4.0–10.5)
nRBC: 0 % (ref 0.0–0.2)

## 2023-06-24 LAB — TROPONIN I (HIGH SENSITIVITY)
Troponin I (High Sensitivity): 8 ng/L (ref <18)
Troponin I (High Sensitivity): 8 ng/L (ref <18)

## 2023-06-24 LAB — BASIC METABOLIC PANEL WITH GFR
Anion gap: 10 (ref 5–15)
BUN: 16 mg/dL (ref 8–23)
CO2: 24 mmol/L (ref 22–32)
Calcium: 9.7 mg/dL (ref 8.9–10.3)
Chloride: 107 mmol/L (ref 98–111)
Creatinine, Ser: 1.01 mg/dL — ABNORMAL HIGH (ref 0.44–1.00)
GFR, Estimated: 56 mL/min — ABNORMAL LOW (ref >60)
Glucose, Bld: 94 mg/dL (ref 70–99)
Potassium: 3.9 mmol/L (ref 3.5–5.1)
Sodium: 141 mmol/L (ref 135–145)

## 2023-06-24 MED ORDER — LIDOCAINE VISCOUS HCL 2 % MT SOLN
15.0000 mL | Freq: Once | OROMUCOSAL | Status: AC
  Administered 2023-06-24: 15 mL via ORAL
  Filled 2023-06-24: qty 15

## 2023-06-24 MED ORDER — PANTOPRAZOLE SODIUM 40 MG PO TBEC: 40.0000 mg | DELAYED_RELEASE_TABLET | Freq: Every day | ORAL | 1 refills | Status: AC

## 2023-06-24 MED ORDER — ALUM & MAG HYDROXIDE-SIMETH 200-200-20 MG/5ML PO SUSP
30.0000 mL | Freq: Once | ORAL | Status: AC
  Administered 2023-06-24: 30 mL via ORAL
  Filled 2023-06-24: qty 30

## 2023-06-24 NOTE — ED Provider Notes (Signed)
 [facility] Provider Note    Event Date/Time   First MD Initiated Contact with Patient 06/24/23 1505     (approximate)   History   Chest Pain   HPI  Erin Patrick is a 78 y.o. female  who presents to the emergency department today because of concern for chest pain. She says that she gets this pain frequently. She has a hard time describing it but it is located in her center chest. She felt some pain in her arm today. It does wax and wane. The patient felt like today was more severe than it normally is, but does state it has been worse in the past. Per chart review the patient was seen in outside ER roughly 6 months ago for chest pain. Patient does have some dementia so cannot remember exactly the work up she has had for this in the past, and family at bedside is not sure either.      Physical Exam   Triage Vital Signs: ED Triage Vitals  Encounter Vitals Group     BP 06/24/23 1236 102/68     Systolic BP Percentile --      Diastolic BP Percentile --      Pulse Rate 06/24/23 1236 67     Resp 06/24/23 1236 20     Temp 06/24/23 1236 97.9 F (36.6 C)     Temp Source 06/24/23 1236 Oral     SpO2 06/24/23 1236 100 %     Weight 06/24/23 1440 186 lb 8.2 oz (84.6 kg)     Height 06/24/23 1440 5' 2.25" (1.581 m)     Head Circumference --      Peak Flow --      Pain Score 06/24/23 1234 0     Pain Loc --      Pain Education --      Exclude from Growth Chart --     Most recent vital signs: Vitals:   06/24/23 1236  BP: 102/68  Pulse: 67  Resp: 20  Temp: 97.9 F (36.6 C)  SpO2: 100%   General: Awake, alert, oriented. CV:  Good peripheral perfusion. Regular rate and rhythm. Resp:  Normal effort. Lungs clear. Abd:  No distention.    ED Results / Procedures / Treatments   Labs (all labs ordered are listed, but only abnormal results are displayed) Labs Reviewed  BASIC METABOLIC PANEL WITH GFR - Abnormal; Notable for the following components:       Result Value   Creatinine, Ser 1.01 (*)    GFR, Estimated 56 (*)    All other components within normal limits  CBC - Abnormal; Notable for the following components:   RBC 5.12 (*)    All other components within normal limits  TROPONIN I (HIGH SENSITIVITY)  TROPONIN I (HIGH SENSITIVITY)     EKG  I, Marylynn Soho, attending physician, personally viewed and interpreted this EKG  EKG Time: 1240 Rate: 70 Rhythm: sinus rhythm with PVC Axis: normal Intervals: qtc 457 QRS: narrow ST changes: no st elevation Impression: abnormal ekg    RADIOLOGY I independently interpreted and visualized the CXR. My interpretation: No pneumonia Radiology interpretation:  IMPRESSION:  No active cardiopulmonary disease.     PROCEDURES:  Critical Care performed: No  MEDICATIONS ORDERED IN ED: Medications - No data to display   IMPRESSION / MDM / ASSESSMENT AND PLAN / ED COURSE  I reviewed the triage vital signs and the nursing notes.  Differential diagnosis includes, but is not limited to, ACS, pneumonia, pneumothorax, PE, dissection, esophagitis  Patient's presentation is most consistent with acute presentation with potential threat to life or bodily function.  Patient presented to the emergency department today because of concerns for chest pain.  Patient does state that she has had this for quite some time.  On exam patient lungs are clear.  Chest x-ray without pneumonia or pneumothorax.  Blood work with troponin negative x 2.  Patient did feel some improvement after GI cocktail.  This time I have low suspicion for dissection or PE.  Given improvement after GI cocktail would consider esophagitis.  Will plan on discharging.  Will give prescription for Pepcid.     FINAL CLINICAL IMPRESSION(S) / ED DIAGNOSES      ED Discharge Orders          Ordered    pantoprazole (PROTONIX) 40 MG tablet  Daily        06/24/23 1653             Note:  This  document was prepared using Dragon voice recognition software and may include unintentional dictation errors.    Marylynn Soho, MD 06/24/23 [phone]

## 2023-06-24 NOTE — ED Notes (Signed)
 See triage note  Presents with family with some chest pain  States she was at a day care program  and developed pain  states she has had pain off and on for a long while  Denies any pain at present

## 2023-06-24 NOTE — ED Triage Notes (Signed)
 Patient states chest pain that radiates into bilateral jaws, started approximately 45 minutes PTA.

## 2023-06-25 ENCOUNTER — Telehealth: Payer: Self-pay

## 2023-06-25 NOTE — Transitions of Care (Post Inpatient/ED Visit) (Signed)
   06/25/2023  Name: Erin Patrick MRN: [id number] DOB: 06-18-1945  Today's TOC FU Call Status: Today's TOC FU Call Status:: Successful TOC FU Call Completed TOC FU Call Complete Date: 06/25/23 Patient's Name and Date of Birth confirmed.  Transition Care Management Follow-up Telephone Call Date of Discharge: 06/24/23 Discharge Facility: [facility]) Type of Discharge: Emergency Department Reason for ED Visit: Other: (chest pain) How have you been since you were released from the hospital?: Better Any questions or concerns?: No  Items Reviewed: Did you receive and understand the discharge instructions provided?: Yes Medications obtained,verified, and reconciled?: Yes (Medications Reviewed) Any new allergies since your discharge?: No Dietary orders reviewed?: Yes Do you have support at home?: Yes People in Home [RPT]: child(ren), adult  Medications Reviewed Today: Medications Reviewed Today     Reviewed by Darrall Ellison, LPN (Licensed Practical Nurse) on 06/25/23 at 1058  Med List Status: <None>   Medication Order Taking? Sig Documenting Provider Last Dose Status Informant  albuterol  (VENTOLIN  HFA) 108 (90 Base) MCG/ACT inhaler [id number]  Inhale 2 puffs into the lungs every 6 (six) hours as needed for wheezing or shortness of breath. Use with spacer Terre Ferri P, DO  Active   atorvastatin  (LIPITOR) 10 MG tablet [id number] No TAKE 1 TABLET BY MOUTH ONCE EVERY EVENING Johnson, Megan P, DO Taking Active   Diclofenac  Sodium 3 % GEL [id number] No Apply 1 Application topically 3 (three) times daily as needed. Cannady, Jolene T, NP Taking Active   donepezil  (ARICEPT ) 10 MG tablet [id number] No Take 1 tablet (10 mg total) by mouth at bedtime. Terre Ferri P, DO Taking Active   gabapentin  (NEURONTIN ) 100 MG capsule [id number] No Take 1 capsule (100 mg total) by mouth 2 (two) times daily. Terre Ferri P, DO Taking Active   hydrocortisone  cream 1 % [id number] No  Apply 1 Application topically 2 (two) times daily. For itching Stafford Eagles, PA-C Taking Active   pantoprazole  (PROTONIX ) 40 MG tablet [id number]  Take 1 tablet (40 mg total) by mouth daily. Marylynn Soho, MD  Active   Spacer/Aero-Holding [place] DEVI [id number]  Use with inhaler Dx: cough Solomon Dupre, DO  Active             Home Care and Equipment/Supplies: Were Home Health Services Ordered?: NA Any new equipment or medical supplies ordered?: NA  Functional Questionnaire: Do you need assistance with bathing/showering or dressing?: No Do you need assistance with meal preparation?: No Do you need assistance with eating?: No Do you have difficulty maintaining continence: No Do you need assistance with getting out of bed/getting out of a chair/moving?: No Do you have difficulty managing or taking your medications?: No  Follow up appointments reviewed: PCP Follow-up appointment confirmed?: Yes Date of PCP follow-up appointment?: 06/29/23 Follow-up Provider: [facility] Follow-up appointment confirmed?: NA Do you need transportation to your follow-up appointment?: No Do you understand care options if your condition(s) worsen?: Yes-patient verbalized understanding    SIGNATURE Darrall Ellison, LPN [facility] Nurse Health Advisor Direct Dial [phone]

## 2023-06-26 NOTE — Telephone Encounter (Signed)
Noted and scheduled 

## 2023-06-29 ENCOUNTER — Encounter: Payer: Self-pay | Admitting: Family Medicine

## 2023-06-29 ENCOUNTER — Ambulatory Visit: Payer: Medicare PPO | Admitting: Family Medicine

## 2023-06-29 VITALS — BP 130/75 | HR 83 | Ht 62.5 in | Wt 187.2 lb

## 2023-06-29 DIAGNOSIS — R053 Chronic cough: Secondary | ICD-10-CM

## 2023-06-29 DIAGNOSIS — K219 Gastro-esophageal reflux disease without esophagitis: Secondary | ICD-10-CM

## 2023-06-29 DIAGNOSIS — M546 Pain in thoracic spine: Secondary | ICD-10-CM

## 2023-06-29 MED ORDER — GABAPENTIN 100 MG PO CAPS: 100.0000 mg | ORAL_CAPSULE | Freq: Two times a day (BID) | ORAL | 1 refills | Status: AC

## 2023-06-29 NOTE — Progress Notes (Signed)
 BP 130/75 (BP Location: Left Arm, Patient Position: Sitting, Cuff Size: Normal)   Pulse 83   Ht 5' 2.5" (1.588 m)   Wt 187 lb 3.2 oz (84.9 kg)   LMP  (LMP Unknown)   BMI 33.69 kg/m    Subjective:    Patient ID: Erin Patrick, female    DOB: [birth year], 78 y.o.   MRN: [id number]  HPI: Erin Patrick is a 78 y.o. female  Chief Complaint  Patient presents with   Follow-up    ED f/u 06/24/23 for chest pain.    Back Pain    Right sided. Onset 2 days ago. Denies injury. Dull soreness. Has never happened before.    ER FOLLOW UP Time since discharge: 5 days Hospital/facility: [facility] Diagnosis: GERD Procedures/tests:  CLINICAL DATA:  Chest pain.   EXAM: CHEST - 2 VIEW   COMPARISON:  Chest radiograph dated 09/20/2020.   FINDINGS: No focal consolidation, pleural effusion or pneumothorax. The cardiac silhouette is within normal limits. No acute osseous pathology.   IMPRESSION: No active cardiopulmonary disease. Consultants: None New medications: protonix  Discharge instructions:  Follow up here Status: better  She notes that since she got out of the ER, she has been feeling well. She has not been coughing. She notes that she has not been having any chest pain, but she has been having some R sided back pain. She and her grandson are not sure if she picked up the protonix  after her ER visit. She is otherwise doing well with no other concerns or complaints at this time.   Relevant past medical, surgical, family and social history reviewed and updated as indicated. Interim medical history since our last visit reviewed. Allergies and medications reviewed and updated.  Review of Systems  Constitutional: Negative.   Respiratory: Negative.    Cardiovascular: Negative.   Gastrointestinal: Negative.   Musculoskeletal:  Positive for back pain and myalgias. Negative for arthralgias, gait problem, joint swelling, neck pain and neck stiffness.  Skin: Negative.   Psychiatric/Behavioral:  Negative.      Per HPI unless specifically indicated above     Objective:     BP 130/75 (BP Location: Left Arm, Patient Position: Sitting, Cuff Size: Normal)   Pulse 83   Ht 5' 2.5" (1.588 m)   Wt 187 lb 3.2 oz (84.9 kg)   LMP  (LMP Unknown)   BMI 33.69 kg/m   Wt Readings from Last 3 Encounters:  06/29/23 187 lb 3.2 oz (84.9 kg)  06/24/23 186 lb 8.2 oz (84.6 kg)  06/01/23 186 lb 9.6 oz (84.6 kg)    Physical Exam Vitals and nursing note reviewed.  Constitutional:      General: She is not in acute distress.    Appearance: Normal appearance. She is not ill-appearing, toxic-appearing or diaphoretic.  HENT:     Head: Normocephalic and atraumatic.     Right Ear: External ear normal.     Left Ear: External ear normal.     Nose: Nose normal.     Mouth/Throat:     Mouth: Mucous membranes are moist.     Pharynx: Oropharynx is clear.  Eyes:     General: No scleral icterus.       Right eye: No discharge.        Left eye: No discharge.     Extraocular Movements: Extraocular movements intact.     Conjunctiva/sclera: Conjunctivae normal.     Pupils: Pupils are equal, round, and reactive to light.  Cardiovascular:  Rate and Rhythm: Normal rate and regular rhythm.     Pulses: Normal pulses.     Heart sounds: Normal heart sounds. No murmur heard.    No friction rub. No gallop.  Pulmonary:     Effort: Pulmonary effort is normal. No respiratory distress.     Breath sounds: Normal breath sounds. No stridor. No wheezing, rhonchi or rales.  Chest:     Chest wall: No tenderness.  Musculoskeletal:        General: Normal range of motion.     Cervical back: Normal range of motion and neck supple.  Skin:    General: Skin is warm and dry.     Capillary Refill: Capillary refill takes less than 2 seconds.     Coloration: Skin is not jaundiced or pale.     Findings: No bruising, erythema, lesion or rash.  Neurological:     General: No focal deficit present.     Mental Status: She is  alert and oriented to person, place, and time. Mental status is at baseline.  Psychiatric:        Mood and Affect: Mood normal.        Behavior: Behavior normal.        Thought Content: Thought content normal.        Judgment: Judgment normal.     Results for orders placed or performed during the hospital encounter of 06/24/23  Basic metabolic panel   Collection Time: 06/24/23 12:38 PM  Result Value Ref Range   Sodium 141 135 - 145 mmol/L   Potassium 3.9 3.5 - 5.1 mmol/L   Chloride 107 98 - 111 mmol/L   CO2 24 22 - 32 mmol/L   Glucose, Bld 94 70 - 99 mg/dL   BUN 16 8 - 23 mg/dL   Creatinine, Ser 1.61 (H) 0.44 - 1.00 mg/dL   Calcium  9.7 8.9 - 10.3 mg/dL   GFR, Estimated 56 (L) >60 mL/min   Anion gap 10 5 - 15  CBC   Collection Time: 06/24/23 12:38 PM  Result Value Ref Range   WBC 4.9 4.0 - 10.5 K/uL   RBC 5.12 (H) 3.87 - 5.11 MIL/uL   Hemoglobin 14.2 12.0 - 15.0 g/dL   HCT 09.6 04.5 - 40.9 %   MCV 87.7 80.0 - 100.0 fL   MCH 27.7 26.0 - 34.0 pg   MCHC 31.6 30.0 - 36.0 g/dL   RDW 81.1 91.4 - 78.2 %   Platelets 304 150 - 400 K/uL   nRBC 0.0 0.0 - 0.2 %  Troponin I (High Sensitivity)   Collection Time: 06/24/23 12:38 PM  Result Value Ref Range   Troponin I (High Sensitivity) 8 <18 ng/L  Troponin I (High Sensitivity)   Collection Time: 06/24/23  3:36 PM  Result Value Ref Range   Troponin I (High Sensitivity) 8 <18 ng/L      Assessment & Plan:   Problem List Items Addressed This Visit   None Visit Diagnoses       Acute right-sided thoracic back pain    -  Primary   Concern for referred pain from GERD. Will treat GERD. Call with any concerns.     Gastroesophageal reflux disease, unspecified whether esophagitis present       Will start her protonix . Call if not getting better or getting worse.     Chronic cough       Doing well on her albuterol . Call with any concerns.  Follow up plan: Return in about 2 months (around 08/29/2023).

## 2023-06-29 NOTE — Patient Instructions (Signed)
 If your back is not better by [facility]- CALL ME

## 2023-08-30 ENCOUNTER — Ambulatory Visit: Admitting: Family Medicine

## 2023-09-15 ENCOUNTER — Emergency Department

## 2023-09-15 ENCOUNTER — Other Ambulatory Visit: Payer: Self-pay

## 2023-09-15 ENCOUNTER — Emergency Department
Admission: EM | Admit: 2023-09-15 | Discharge: 2023-09-16 | Disposition: A | Discharge status: Home / Self Care | Attending: Emergency Medicine | Admitting: Emergency Medicine

## 2023-09-15 DIAGNOSIS — F0390 Unspecified dementia without behavioral disturbance: Secondary | ICD-10-CM | POA: Diagnosis not present

## 2023-09-15 DIAGNOSIS — R109 Unspecified abdominal pain: Secondary | ICD-10-CM | POA: Diagnosis present

## 2023-09-15 DIAGNOSIS — R0602 Shortness of breath: Secondary | ICD-10-CM | POA: Insufficient documentation

## 2023-09-15 DIAGNOSIS — K5730 Diverticulosis of large intestine without perforation or abscess without bleeding: Secondary | ICD-10-CM | POA: Insufficient documentation

## 2023-09-15 DIAGNOSIS — E119 Type 2 diabetes mellitus without complications: Secondary | ICD-10-CM | POA: Diagnosis not present

## 2023-09-15 DIAGNOSIS — N390 Urinary tract infection, site not specified: Secondary | ICD-10-CM | POA: Diagnosis not present

## 2023-09-15 DIAGNOSIS — K219 Gastro-esophageal reflux disease without esophagitis: Secondary | ICD-10-CM | POA: Diagnosis not present

## 2023-09-15 LAB — COMPREHENSIVE METABOLIC PANEL WITH GFR
ALT: 31 U/L (ref 0–44)
AST: 49 U/L — ABNORMAL HIGH (ref 15–41)
Albumin: 3.8 g/dL (ref 3.5–5.0)
Alkaline Phosphatase: 86 U/L (ref 38–126)
Anion gap: 8 (ref 5–15)
BUN: 9 mg/dL (ref 8–23)
CO2: 23 mmol/L (ref 22–32)
Calcium: 8.9 mg/dL (ref 8.9–10.3)
Chloride: 110 mmol/L (ref 98–111)
Creatinine, Ser: 1.02 mg/dL — ABNORMAL HIGH (ref 0.44–1.00)
GFR, Estimated: 56 mL/min — ABNORMAL LOW (ref >60)
Glucose, Bld: 131 mg/dL — ABNORMAL HIGH (ref 70–99)
Potassium: 4.2 mmol/L (ref 3.5–5.1)
Sodium: 141 mmol/L (ref 135–145)
Total Bilirubin: 0.8 mg/dL (ref 0.0–1.2)
Total Protein: 6.8 g/dL (ref 6.5–8.1)

## 2023-09-15 LAB — CBC
HCT: 40.9 % (ref 36.0–46.0)
Hemoglobin: 13.2 g/dL (ref 12.0–15.0)
MCH: 27.8 pg (ref 26.0–34.0)
MCHC: 32.3 g/dL (ref 30.0–36.0)
MCV: 86.3 fL (ref 80.0–100.0)
Platelets: 266 K/uL (ref 150–400)
RBC: 4.74 MIL/uL (ref 3.87–5.11)
RDW: 13.7 % (ref 11.5–15.5)
WBC: 5.1 K/uL (ref 4.0–10.5)
nRBC: 0 % (ref 0.0–0.2)

## 2023-09-15 LAB — LAB REPORT - SCANNED
Albumin, Urine POC: 0.6
Albumin/Creatinine Ratio, Urine, POC: 2
Creatinine, POC: 331 mg/dL
EGFR: 55

## 2023-09-15 LAB — TROPONIN I (HIGH SENSITIVITY)
Troponin I (High Sensitivity): 8 ng/L (ref <18)
Troponin I (High Sensitivity): 8 ng/L (ref <18)

## 2023-09-15 LAB — LIPASE, BLOOD: Lipase: 37 U/L (ref 11–51)

## 2023-09-15 MED ORDER — IOHEXOL 300 MG/ML  SOLN
100.0000 mL | Freq: Once | INTRAMUSCULAR | Status: AC | PRN
  Administered 2023-09-15: 100 mL via INTRAVENOUS

## 2023-09-15 MED ORDER — ALUM & MAG HYDROXIDE-SIMETH 200-200-20 MG/5ML PO SUSP
30.0000 mL | Freq: Once | ORAL | Status: AC
  Administered 2023-09-15: 30 mL via ORAL
  Filled 2023-09-15: qty 30

## 2023-09-15 NOTE — Discharge Instructions (Signed)
 As we discussed please use over-the-counter liquid Maalox 1 capful before going to bed each night to help for heartburn/reflux discomfort.  Return to the emergency department for any worsening pain vomiting unable to keep down fluids or any other symptom personally concerning to yourself.

## 2023-09-15 NOTE — ED Provider Notes (Signed)
 [facility] Provider Note    Event Date/Time   First MD Initiated Contact with Patient 09/15/23 2229     (approximate)  History   Chief Complaint: Abdominal Pain  HPI  Erin Patrick is a 78 y.o. female with a past medical history of dementia, gastric reflux, diabetes, presents to the emergency department for abdominal pain.  According to the daughter who is here with the patient patient is coming from home for nausea and vomiting since earlier today and has been complaining of abdominal pain over the last few days as well as some chest discomfort today that started after the vomiting.  Here the patient denies any chest pain to myself does state some abdominal discomfort but only when asked.  Daughter states the patient does not take any medication although she is prescribed medication for reflux she refuses to take it.  Here the patient appears well, no distress, answering questions appropriately.  Vital signs reassuring.  Physical Exam   Triage Vital Signs: ED Triage Vitals  Encounter Vitals Group     BP 09/15/23 1923 (!) 129/59     Girls Systolic BP Percentile --      Girls Diastolic BP Percentile --      Boys Systolic BP Percentile --      Boys Diastolic BP Percentile --      Pulse Rate 09/15/23 1923 64     Resp 09/15/23 1923 20     Temp 09/15/23 1923 98.4 F (36.9 C)     Temp Source 09/15/23 1923 Oral     SpO2 09/15/23 1923 100 %     Weight 09/15/23 1922 185 lb (83.9 kg)     Height 09/15/23 1922 5' 3 (1.6 m)     Head Circumference --      Peak Flow --      Pain Score 09/15/23 1922 5     Pain Loc --      Pain Education --      Exclude from Growth Chart --     Most recent vital signs: Vitals:   09/15/23 1923 09/15/23 2126  BP: (!) 129/59 (!) 112/56  Pulse: 64 64  Resp: 20 16  Temp: 98.4 F (36.9 C)   SpO2: 100% 100%    General: Awake, no distress.  CV:  Good peripheral perfusion.  Regular rate and rhythm  Resp:  Normal effort.  Equal  breath sounds bilaterally.  Abd:  No distention.  Soft, mild diffuse tenderness, no rebound guarding or distention.  ED Results / Procedures / Treatments   EKG  EKG viewed and interpreted by myself shows normal sinus rhythm at 74 bpm with a narrow QRS, normal axis, normal intervals, no concerning ST changes.  RADIOLOGY  I have reviewed and interpreted the chest x-ray images.  No consolidation on my evaluation. Radiology is read the x-ray is negative   MEDICATIONS ORDERED IN ED: Medications  alum & mag hydroxide-simeth (MAALOX/MYLANTA) 200-200-20 MG/5ML suspension 30 mL (30 mLs Oral Given 09/15/23 2301)  iohexol  (OMNIPAQUE ) 300 MG/ML solution 100 mL (100 mLs Intravenous Contrast Given 09/15/23 2251)     IMPRESSION / MDM / ASSESSMENT AND PLAN / ED COURSE  I reviewed the triage vital signs and the nursing notes.  Patient's presentation is most consistent with acute presentation with potential threat to life or bodily function.  Patient presents to the emergency department for abdominal pain nausea and vomiting earlier today.  Patient denies any pain currently however does have slight tenderness  she states when I palpate her abdomen.  Patient has dementia and her history is somewhat unreliable.  Patient CBC is reassuring, chemistry overall reassuring lipase is normal troponin is negative x 2.  Given the patient's mild tenderness we will obtain a CT scan of the abdomen/pelvis as a precaution.  We have dosed Maalox I told the patient to use over-the-counter Maalox as needed for gastric reflux.  If the CT is negative I believe the patient could be discharged home.  Patient care signed out to oncoming provider.  FINAL CLINICAL IMPRESSION(S) / ED DIAGNOSES   Abdominal pain Gastric reflux  Note:  This document was prepared using Dragon voice recognition software and may include unintentional dictation errors.   Dorothyann Drivers, MD 09/15/23 [phone]

## 2023-09-15 NOTE — ED Triage Notes (Signed)
 Pt to ED via EMS from home, pt repots abd pain that began a few days ago pt repots n/v that began today. Pt reports she has intermittent burning sensation in her chest for over a year. Pt was given SL nitroglycerin and 4mg  zofran by ems.

## 2023-09-15 NOTE — ED Provider Notes (Signed)
-----------------------------------------   11:19 PM on 09/15/2023 -----------------------------------------  Assuming care from Dr. Dorothyann.  In short, Erin Patrick is a 78 y.o. female with a chief complaint of abdominal pain.  Refer to the original H&P for additional details.  The current plan of care is to follow up urinalysis.  Anticipate discharge.   Clinical Course as of 09/16/23 0527  Thu Sep 16, 2023  0220 Patient has been stable and sleeping comfortably.  Evaluation is reassuring but she does appear to have a urinary tract infection.  I treated with a dose of Keflex  and wrote her prescription.  She is pleasant, awake and alert, and ready to go home.  She is ambulatory without requiring any assistance.  Daughter understands and agrees with the plan as well.  I gave my usual and customary return precautions [CF]    Clinical Course User Index [CF] Gordan Huxley, MD     Medications  alum & mag hydroxide-simeth (MAALOX/MYLANTA) 200-200-20 MG/5ML suspension 30 mL (30 mLs Oral Given 09/15/23 2301)  iohexol  (OMNIPAQUE ) 300 MG/ML solution 100 mL (100 mLs Intravenous Contrast Given 09/15/23 2251)  cephALEXin  (KEFLEX ) capsule 500 mg (500 mg Oral Given 09/16/23 0115)     ED Discharge Orders          Ordered    cephALEXin  (KEFLEX ) 500 MG capsule  2 times daily        09/16/23 0220           Final diagnoses:  Abdominal pain, unspecified abdominal location  Gastroesophageal reflux disease, unspecified whether esophagitis present  Urinary tract infection without hematuria, site unspecified     Gordan Huxley, MD 09/16/23 [phone]

## 2023-09-16 ENCOUNTER — Other Ambulatory Visit: Payer: Self-pay

## 2023-09-16 ENCOUNTER — Emergency Department
Admission: EM | Admit: 2023-09-16 | Discharge: 2023-09-16 | Disposition: A | Discharge status: Home / Self Care | Attending: Emergency Medicine | Admitting: Emergency Medicine

## 2023-09-16 ENCOUNTER — Emergency Department

## 2023-09-16 DIAGNOSIS — F0390 Unspecified dementia without behavioral disturbance: Secondary | ICD-10-CM | POA: Insufficient documentation

## 2023-09-16 DIAGNOSIS — R0602 Shortness of breath: Secondary | ICD-10-CM | POA: Insufficient documentation

## 2023-09-16 DIAGNOSIS — E119 Type 2 diabetes mellitus without complications: Secondary | ICD-10-CM | POA: Insufficient documentation

## 2023-09-16 DIAGNOSIS — R0789 Other chest pain: Secondary | ICD-10-CM | POA: Insufficient documentation

## 2023-09-16 LAB — CBC WITH DIFFERENTIAL/PLATELET
Abs Immature Granulocytes: 0.01 K/uL (ref 0.00–0.07)
Basophils Absolute: 0 K/uL (ref 0.0–0.1)
Basophils Relative: 1 %
Eosinophils Absolute: 0 K/uL (ref 0.0–0.5)
Eosinophils Relative: 1 %
HCT: 42 % (ref 36.0–46.0)
Hemoglobin: 13.5 g/dL (ref 12.0–15.0)
Immature Granulocytes: 0 %
Lymphocytes Relative: 40 %
Lymphs Abs: 1.9 K/uL (ref 0.7–4.0)
MCH: 28 pg (ref 26.0–34.0)
MCHC: 32.1 g/dL (ref 30.0–36.0)
MCV: 87 fL (ref 80.0–100.0)
Monocytes Absolute: 0.4 K/uL (ref 0.1–1.0)
Monocytes Relative: 9 %
Neutro Abs: 2.3 K/uL (ref 1.7–7.7)
Neutrophils Relative %: 49 %
Platelets: 287 K/uL (ref 150–400)
RBC: 4.83 MIL/uL (ref 3.87–5.11)
RDW: 13.8 % (ref 11.5–15.5)
WBC: 4.6 K/uL (ref 4.0–10.5)
nRBC: 0 % (ref 0.0–0.2)

## 2023-09-16 LAB — URINALYSIS, ROUTINE W REFLEX MICROSCOPIC
Bilirubin Urine: NEGATIVE
Glucose, UA: NEGATIVE mg/dL
Hgb urine dipstick: NEGATIVE
Ketones, ur: NEGATIVE mg/dL
Nitrite: NEGATIVE
Protein, ur: NEGATIVE mg/dL
Specific Gravity, Urine: >1.046 — ABNORMAL HIGH (ref 1.005–1.030)
pH: 5 (ref 5.0–8.0)

## 2023-09-16 LAB — COMPREHENSIVE METABOLIC PANEL WITH GFR
ALT: 32 U/L (ref 0–44)
AST: 40 U/L (ref 15–41)
Albumin: 4 g/dL (ref 3.5–5.0)
Alkaline Phosphatase: 81 U/L (ref 38–126)
Anion gap: 10 (ref 5–15)
BUN: 9 mg/dL (ref 8–23)
CO2: 24 mmol/L (ref 22–32)
Calcium: 9.8 mg/dL (ref 8.9–10.3)
Chloride: 107 mmol/L (ref 98–111)
Creatinine, Ser: 0.97 mg/dL (ref 0.44–1.00)
GFR, Estimated: 59 mL/min — ABNORMAL LOW (ref >60)
Glucose, Bld: 103 mg/dL — ABNORMAL HIGH (ref 70–99)
Potassium: 3.6 mmol/L (ref 3.5–5.1)
Sodium: 141 mmol/L (ref 135–145)
Total Bilirubin: 0.6 mg/dL (ref 0.0–1.2)
Total Protein: 7 g/dL (ref 6.5–8.1)

## 2023-09-16 LAB — BRAIN NATRIURETIC PEPTIDE: B Natriuretic Peptide: 37.1 pg/mL (ref 0.0–100.0)

## 2023-09-16 LAB — TROPONIN I (HIGH SENSITIVITY)
Troponin I (High Sensitivity): 8 ng/L (ref <18)
Troponin I (High Sensitivity): 9 ng/L (ref <18)

## 2023-09-16 MED ORDER — ACETAMINOPHEN 500 MG PO TABS
1000.0000 mg | ORAL_TABLET | Freq: Once | ORAL | Status: AC
  Administered 2023-09-16: 1000 mg via ORAL
  Filled 2023-09-16: qty 2

## 2023-09-16 MED ORDER — CEPHALEXIN 500 MG PO CAPS
500.0000 mg | ORAL_CAPSULE | Freq: Once | ORAL | Status: AC
  Administered 2023-09-16: 500 mg via ORAL
  Filled 2023-09-16: qty 1

## 2023-09-16 MED ORDER — LIDOCAINE 5 % EX PTCH
1.0000 | MEDICATED_PATCH | CUTANEOUS | Status: DC
  Administered 2023-09-16: 1 via TRANSDERMAL
  Filled 2023-09-16: qty 1

## 2023-09-16 MED ORDER — CEPHALEXIN 500 MG PO CAPS
500.0000 mg | ORAL_CAPSULE | Freq: Two times a day (BID) | ORAL | 0 refills | Status: AC
Start: 2023-09-16 — End: ?

## 2023-09-16 NOTE — ED Triage Notes (Signed)
 Pt to ED from home for SOB per daughter who called remotely for EMS. Pt denies SOB/chest pain/pain at this time. Pt arrived on room air with even unlabored respirations. A&O x2-3

## 2023-09-16 NOTE — ED Provider Notes (Signed)
-----------------------------------------   5:29 PM on 09/16/2023 ----------------------------------------- Patient care assumed from Dr. Waymond.  Patient's workup is reassuring, chest x-ray is clear, lab work is reassuring with a normal CBC chemistry troponin negative x 2 BNP normal.  Patient is symptom-free reassuring vital signs.  Will discharge from the emergency department.   Dorothyann Drivers, MD 09/16/23 1729

## 2023-09-16 NOTE — Discharge Instructions (Addendum)
 Your workup today show normal results.  Please follow-up with your doctor within the next several days for recheck/reevaluation.

## 2023-09-16 NOTE — ED Provider Notes (Signed)
 [facility] Provider Note    Event Date/Time   First MD Initiated Contact with Patient 09/16/23 1327     (approximate)   History   Shortness of Breath   HPI  Erin Patrick is a 78 y.o. female with history of dementia, MDD, diabetes, presenting with shortness of breath.  Patient denies shortness of breath at this time, states that she did not remember having shortness of breath.  She denies any trauma or falls.  Did note left-sided chest pain that is reproducible.  Nonradiating.  No leg swelling.  States that she had a single cough earlier.  No fever.  No abdominal pain or back pain.  [facility], [facility] were called out because patient was complained of shortness of breath to her daughter who called 911.  They arrived patient was not complaining of any shortness of breath or chest pain or pain anywhere.  Was 100% for EMS on room air on arrival.   On independent review, she seen in the past in May for chest pain, had noted that chest pain has been ongoing off and on for a long time.  She was seen by primary care doctor in mid May, has history of GERD, chronic cough.  Of note patient was seen yesterday night in the emergency department and diagnosed with acid reflux as well as a UTI.   Physical Exam   Triage Vital Signs: ED Triage Vitals  Encounter Vitals Group     BP      Girls Systolic BP Percentile      Girls Diastolic BP Percentile      Boys Systolic BP Percentile      Boys Diastolic BP Percentile      Pulse      Resp      Temp      Temp src      SpO2      Weight      Height      Head Circumference      Peak Flow      Pain Score      Pain Loc      Pain Education      Exclude from Growth Chart     Most recent vital signs: Vitals:   09/16/23 1345 09/16/23 1424  BP:    Pulse: 62   Resp: 16   Temp:  98.4 F (36.9 C)  SpO2: 99%      General: Awake, no distress.  Alert and oriented x 2 CV:  Good peripheral perfusion.   Resp:  Normal effort.  No tachypnea or respiratory distress Abd:  No distention.  Soft nontender Other:  No unilateral calf swelling or tenderness, no lower extremity edema, she does have reproducible left anterior thoracic cage tenderness.  No swelling or erythema or ecchymoses.   ED Results / Procedures / Treatments   Labs (all labs ordered are listed, but only abnormal results are displayed) Labs Reviewed  CBC WITH DIFFERENTIAL/PLATELET  BRAIN NATRIURETIC PEPTIDE  COMPREHENSIVE METABOLIC PANEL WITH GFR  TROPONIN I (HIGH SENSITIVITY)  TROPONIN I (HIGH SENSITIVITY)     EKG  EKG shows, EKG shows sinus rhythm, rate 56, normal QRS, QTc, no obvious ischemic ST elevation, T wave flattening in aVL, not significantly changed compared to prior  RADIOLOGY On my independent interpretation, chest x-ray without obvious consolidation   PROCEDURES:  Critical Care performed: No  Procedures   MEDICATIONS ORDERED IN ED: Medications  lidocaine  (LIDODERM ) 5 %  1 patch (1 patch Transdermal Patch Applied 09/16/23 1424)  acetaminophen  (TYLENOL ) tablet 1,000 mg (1,000 mg Oral Given 09/16/23 1422)     IMPRESSION / MDM / ASSESSMENT AND PLAN / ED COURSE  I reviewed the triage vital signs and the nursing notes.                              Differential diagnosis includes, but is not limited to, musculoskeletal pain, strain, costochondritis, ACS, angina, consider CHF but patient is not fluid overloaded at this time clinically, considered PE but she is not hypoxic, not tachycardic, not tachypneic, no unilateral calf pain or tenderness, no malignancy or history of blood clots, suspect this is less likely at this time.  Also considered viral illness or infection but patient's cough is chronic, she is not febrile.  Will get labs, EKG, troponin, chest x-ray.  Will give her Lidoderm  patch as well as some Tylenol  for her chest pain.  Patient's presentation is most consistent with acute presentation with  potential threat to life or bodily function.  Independent interpretation of labs and imaging below.  Called daughter [name] to states that patient has quite advanced dementia and lives with her.  Will be able to pick her up when patient is done with her workup.  Patient signed out pending her labs.  If asymptomatic and labs are reassuring, able to be discharged back home.  The patient is on the cardiac monitor to evaluate for evidence of arrhythmia and/or significant heart rate changes.   Clinical Course as of 09/16/23 1515  Thu Sep 16, 2023  1404 DG Chest 2 View No active cardiopulmonary disease.  [TT]    Clinical Course User Index [TT] Waymond, Lorelle Cummins, MD     FINAL CLINICAL IMPRESSION(S) / ED DIAGNOSES   Final diagnoses:  Shortness of breath  Chest wall pain     Rx / DC Orders   ED Discharge Orders     None        Note:  This document was prepared using Dragon voice recognition software and may include unintentional dictation errors.    Waymond Lorelle Cummins, MD 09/16/23 [phone]

## 2023-09-16 NOTE — ED Notes (Signed)
 Lab called this RN to collect another light green tube for the patient's BMP. This RN attempted to obtain blood work x2 and was unsuccessful. Lab called to collect blood.

## 2023-09-17 DIAGNOSIS — G629 Polyneuropathy, unspecified: Secondary | ICD-10-CM | POA: Diagnosis not present

## 2023-09-17 DIAGNOSIS — G309 Alzheimer's disease, unspecified: Secondary | ICD-10-CM | POA: Diagnosis not present

## 2023-09-17 DIAGNOSIS — N182 Chronic kidney disease, stage 2 (mild): Secondary | ICD-10-CM | POA: Diagnosis not present

## 2023-09-17 DIAGNOSIS — K219 Gastro-esophageal reflux disease without esophagitis: Secondary | ICD-10-CM | POA: Diagnosis not present

## 2023-09-17 DIAGNOSIS — M1990 Unspecified osteoarthritis, unspecified site: Secondary | ICD-10-CM | POA: Diagnosis not present

## 2023-09-17 DIAGNOSIS — E785 Hyperlipidemia, unspecified: Secondary | ICD-10-CM | POA: Diagnosis not present

## 2023-09-17 DIAGNOSIS — F324 Major depressive disorder, single episode, in partial remission: Secondary | ICD-10-CM | POA: Diagnosis not present

## 2023-09-17 DIAGNOSIS — K5900 Constipation, unspecified: Secondary | ICD-10-CM | POA: Diagnosis not present

## 2023-09-17 DIAGNOSIS — I129 Hypertensive chronic kidney disease with stage 1 through stage 4 chronic kidney disease, or unspecified chronic kidney disease: Secondary | ICD-10-CM | POA: Diagnosis not present

## 2023-09-18 LAB — URINE CULTURE: Culture: 10000 — AB

## 2023-10-05 ENCOUNTER — Emergency Department

## 2023-10-05 ENCOUNTER — Other Ambulatory Visit: Payer: Self-pay

## 2023-10-05 ENCOUNTER — Emergency Department
Admission: EM | Admit: 2023-10-05 | Discharge: 2023-10-05 | Disposition: A | Discharge status: Home / Self Care | Attending: Emergency Medicine | Admitting: Emergency Medicine

## 2023-10-05 ENCOUNTER — Ambulatory Visit: Payer: Self-pay

## 2023-10-05 DIAGNOSIS — E119 Type 2 diabetes mellitus without complications: Secondary | ICD-10-CM | POA: Diagnosis not present

## 2023-10-05 DIAGNOSIS — D72819 Decreased white blood cell count, unspecified: Secondary | ICD-10-CM | POA: Diagnosis not present

## 2023-10-05 DIAGNOSIS — R0789 Other chest pain: Secondary | ICD-10-CM | POA: Diagnosis not present

## 2023-10-05 DIAGNOSIS — R079 Chest pain, unspecified: Secondary | ICD-10-CM

## 2023-10-05 DIAGNOSIS — F0390 Unspecified dementia without behavioral disturbance: Secondary | ICD-10-CM | POA: Diagnosis not present

## 2023-10-05 DIAGNOSIS — R1084 Generalized abdominal pain: Secondary | ICD-10-CM | POA: Diagnosis not present

## 2023-10-05 DIAGNOSIS — K429 Umbilical hernia without obstruction or gangrene: Secondary | ICD-10-CM | POA: Diagnosis not present

## 2023-10-05 DIAGNOSIS — R1013 Epigastric pain: Secondary | ICD-10-CM | POA: Insufficient documentation

## 2023-10-05 DIAGNOSIS — Z9049 Acquired absence of other specified parts of digestive tract: Secondary | ICD-10-CM | POA: Diagnosis not present

## 2023-10-05 DIAGNOSIS — Z90710 Acquired absence of both cervix and uterus: Secondary | ICD-10-CM | POA: Diagnosis not present

## 2023-10-05 LAB — CBC WITH DIFFERENTIAL/PLATELET
Abs Immature Granulocytes: 0.01 K/uL (ref 0.00–0.07)
Basophils Absolute: 0 K/uL (ref 0.0–0.1)
Basophils Relative: 1 %
Eosinophils Absolute: 0 K/uL (ref 0.0–0.5)
Eosinophils Relative: 1 %
HCT: 45.5 % (ref 36.0–46.0)
Hemoglobin: 14 g/dL (ref 12.0–15.0)
Immature Granulocytes: 0 %
Lymphocytes Relative: 35 %
Lymphs Abs: 1.3 K/uL (ref 0.7–4.0)
MCH: 27.4 pg (ref 26.0–34.0)
MCHC: 30.8 g/dL (ref 30.0–36.0)
MCV: 89 fL (ref 80.0–100.0)
Monocytes Absolute: 0.2 K/uL (ref 0.1–1.0)
Monocytes Relative: 5 %
Neutro Abs: 2.1 K/uL (ref 1.7–7.7)
Neutrophils Relative %: 58 %
Platelets: 234 K/uL (ref 150–400)
RBC: 5.11 MIL/uL (ref 3.87–5.11)
RDW: 13.7 % (ref 11.5–15.5)
WBC: 3.6 K/uL — ABNORMAL LOW (ref 4.0–10.5)
nRBC: 0 % (ref 0.0–0.2)

## 2023-10-05 LAB — COMPREHENSIVE METABOLIC PANEL WITH GFR
ALT: 24 U/L (ref 0–44)
AST: 32 U/L (ref 15–41)
Albumin: 4 g/dL (ref 3.5–5.0)
Alkaline Phosphatase: 66 U/L (ref 38–126)
Anion gap: 11 (ref 5–15)
BUN: 10 mg/dL (ref 8–23)
CO2: 21 mmol/L — ABNORMAL LOW (ref 22–32)
Calcium: 9.2 mg/dL (ref 8.9–10.3)
Chloride: 107 mmol/L (ref 98–111)
Creatinine, Ser: 0.93 mg/dL (ref 0.44–1.00)
GFR, Estimated: >60 mL/min (ref >60)
Glucose, Bld: 166 mg/dL — ABNORMAL HIGH (ref 70–99)
Potassium: 3.7 mmol/L (ref 3.5–5.1)
Sodium: 139 mmol/L (ref 135–145)
Total Bilirubin: 0.5 mg/dL (ref 0.0–1.2)
Total Protein: 7 g/dL (ref 6.5–8.1)

## 2023-10-05 LAB — LIPASE, BLOOD: Lipase: 35 U/L (ref 11–51)

## 2023-10-05 LAB — TROPONIN I (HIGH SENSITIVITY)
Troponin I (High Sensitivity): 8 ng/L (ref <18)
Troponin I (High Sensitivity): 8 ng/L (ref <18)

## 2023-10-05 MED ORDER — ACETAMINOPHEN 325 MG PO TABS
650.0000 mg | ORAL_TABLET | Freq: Once | ORAL | Status: AC
  Administered 2023-10-05: 650 mg via ORAL
  Filled 2023-10-05: qty 2

## 2023-10-05 MED ORDER — MORPHINE SULFATE (PF) 4 MG/ML IV SOLN
4.0000 mg | Freq: Once | INTRAVENOUS | Status: AC
  Administered 2023-10-05: 4 mg via INTRAVENOUS
  Filled 2023-10-05: qty 1

## 2023-10-05 MED ORDER — ALUM & MAG HYDROXIDE-SIMETH 200-200-20 MG/5ML PO SUSP
30.0000 mL | Freq: Once | ORAL | Status: AC
  Administered 2023-10-05: 30 mL via ORAL
  Filled 2023-10-05: qty 30

## 2023-10-05 MED ORDER — IOHEXOL 350 MG/ML SOLN
100.0000 mL | Freq: Once | INTRAVENOUS | Status: AC | PRN
  Administered 2023-10-05: 100 mL via INTRAVENOUS

## 2023-10-05 NOTE — ED Triage Notes (Signed)
 Pt to er, pt states that she is here for chest pain, states that her pain has been off and on, pt states that the pain started around 2am.  Pt states that she has some dizziness when standing.  Pt states that nothing seems to make her pain worse.

## 2023-10-05 NOTE — ED Provider Notes (Signed)
 [facility] Provider Note    Event Date/Time   First MD Initiated Contact with Patient 10/05/23 1005     (approximate)   History   Chest Pain   HPI  Erin Patrick is a 78 year old female with history of dementia, diabetes, GERD presenting to the emergency department for evaluation of chest pain.  Patient reports that around 2 AM she had onset of pain in her chest.  Describes initially as burning, later as sharp.  She reports she has had similar pain in her abdomen but it seemed to go up into her chest today.  Lasted for about an hour.  She now feels like her symptoms have completely resolved.   I did review to the ER visits from 7/30/731 where patient presented with abdominal pain thought to be related to acid reflux as well as shortness of breath with reassuring workup.  Also note that she saw her primary care doctor in May where she was thought to have thoracic back pain related to GERD for which she was started on Protonix .     Physical Exam   Triage Vital Signs: ED Triage Vitals  Encounter Vitals Group     BP 10/05/23 1003 (!) 147/74     Girls Systolic BP Percentile --      Girls Diastolic BP Percentile --      Boys Systolic BP Percentile --      Boys Diastolic BP Percentile --      Pulse Rate 10/05/23 1003 71     Resp 10/05/23 1003 18     Temp 10/05/23 1003 97.7 F (36.5 C)     Temp Source 10/05/23 1003 Oral     SpO2 10/05/23 1003 100 %     Weight 10/05/23 1000 170 lb (77.1 kg)     Height 10/05/23 1000 5' 3 (1.6 m)     Head Circumference --      Peak Flow --      Pain Score 10/05/23 0959 2     Pain Loc --      Pain Education --      Exclude from Growth Chart --     Most recent vital signs: Vitals:   10/05/23 1300 10/05/23 1430  BP: 135/60 (!) 120/49  Pulse: (!) 53 (!) 55  Resp: 18 17  Temp:    SpO2: 100% 96%     General: Awake, interactive  CV:  Regular rate, good peripheral perfusion.  Resp:  Unlabored respirations, lungs  clear to auscultation Chest wall: Nontender to palpation Abd:  Nondistended, soft, nontender Neuro:  Symmetric facial movement, fluid speech   ED Results / Procedures / Treatments   Labs (all labs ordered are listed, but only abnormal results are displayed) Labs Reviewed  CBC WITH DIFFERENTIAL/PLATELET - Abnormal; Notable for the following components:      Result Value   WBC 3.6 (*)    All other components within normal limits  COMPREHENSIVE METABOLIC PANEL WITH GFR - Abnormal; Notable for the following components:   CO2 21 (*)    Glucose, Bld 166 (*)    All other components within normal limits  LIPASE, BLOOD  TROPONIN I (HIGH SENSITIVITY)  TROPONIN I (HIGH SENSITIVITY)     EKG EKG independently reviewed and interpreted by myself demonstrates:  EKG demonstrates sinus rhythm rate of 72, PR 166, QRS 96, QTc 484, nonspecific ST changes that seem possibly more related to wandering baseline  RADIOLOGY Imaging independently reviewed and interpreted by myself demonstrates:  CXR without focal consolidation  Formal Radiology Read:  CT Angio Chest/Abd/Pel for Dissection W and/or Wo Contrast Result Date: 10/05/2023 CLINICAL DATA:  Acute aortic syndrome (AAS) suspected. Chest pain. Dizziness while standing. EXAM: CT ANGIOGRAPHY CHEST, ABDOMEN AND PELVIS TECHNIQUE: Non-contrast CT of the chest was initially obtained. Multidetector CT imaging through the chest, abdomen and pelvis was performed using the standard protocol during bolus administration of intravenous contrast. Multiplanar reconstructed images and MIPs were obtained and reviewed to evaluate the vascular anatomy. RADIATION DOSE REDUCTION: This exam was performed according to the departmental dose-optimization program which includes automated exposure control, adjustment of the mA and/or kV according to patient size and/or use of iterative reconstruction technique. CONTRAST:  OMNIPAQUE  IOHEXOL  350 MG/ML SOLN COMPARISON:  CT scan  abdomen and pelvis from 09/15/2023. FINDINGS: The examination is degraded by patient's motion during data acquisition. CTA CHEST FINDINGS Cardiovascular: No intramural hematoma noted in the thoracic aorta on the unenhanced images. Thoracic aorta is normal in caliber without aneurysm, dissection, vasculitis or significant stenosis. Normal cardiac size. No pericardial effusion. No aortic aneurysm. There are mild peripheral atherosclerotic vascular calcifications of thoracic aorta and its major branches. Mediastinum/Nodes: Visualized thyroid  gland appears grossly unremarkable. No solid / cystic mediastinal masses. The esophagus is nondistended precluding optimal assessment. No axillary, mediastinal or hilar lymphadenopathy by size criteria. Lungs/Pleura: The central tracheo-bronchial tree is patent. There are patchy areas of linear, plate-like atelectasis and/or scarring throughout bilateral lungs. No mass or consolidation. No pleural effusion or pneumothorax. No suspicious lung nodules. Musculoskeletal: The visualized soft tissues of the chest wall are grossly unremarkable. No suspicious osseous lesions. There are mild multilevel degenerative changes in the visualized spine. Evaluation for rib fracture is limited due to motion during data acquisition. Review of the MIP images confirms the above findings. CTA ABDOMEN AND PELVIS FINDINGS VASCULAR Aorta: Normal caliber aorta without aneurysm, dissection, vasculitis or significant stenosis. Celiac: Patent without evidence of aneurysm, dissection, vasculitis or significant stenosis. SMA: Patent without evidence of aneurysm, dissection, vasculitis or significant stenosis. Renals: Both renal arteries are patent without evidence of aneurysm, dissection, vasculitis, fibromuscular dysplasia or significant stenosis. IMA: Patent without evidence of aneurysm, dissection, vasculitis or significant stenosis. Inflow: Patent without evidence of aneurysm, dissection, vasculitis or  significant stenosis. Veins: No obvious venous abnormality within the limitations of this arterial phase study. Review of the MIP images confirms the above findings. NON-VASCULAR Hepatobiliary: The liver is normal in size. Non-cirrhotic configuration. No suspicious mass. No intrahepatic bile duct dilation. There is mild prominence of the extrahepatic bile duct, most likely due to post cholecystectomy status. Gallbladder is surgically absent. Pancreas: Unremarkable. No pancreatic ductal dilatation or surrounding inflammatory changes. Spleen: Within normal limits. No focal lesion. Adrenals/Urinary Tract: Adrenal glands are unremarkable. Redemonstration of a partially exophytic relatively hypoattenuating structure arising from the right kidney upper pole measuring 1.0 x 1.2 cm. This is incompletely characterized on the current exam but grossly unchanged since the prior study. No other suspicious renal lesion. No nephroureterolithiasis or obstructive uropathy. There are multiple left renal sinus cysts. Unremarkable urinary bladder. Stomach/Bowel: No disproportionate dilation of the small or large bowel loops. No evidence of abnormal bowel wall thickening or inflammatory changes. The appendix is unremarkable. There are scattered diverticula mainly in the left hemi colon, without imaging signs of diverticulitis. Vascular/Lymphatic: No ascites or pneumoperitoneum. No abdominal or pelvic lymphadenopathy, by size criteria. No aneurysmal dilation of the major abdominal arteries. There are mild peripheral atherosclerotic vascular calcifications of the aorta and its major  branches. Reproductive: The uterus is surgically absent. No large adnexal mass. Other: There is a tiny fat containing umbilical hernia. The soft tissues and abdominal wall are otherwise unremarkable. Musculoskeletal: No suspicious osseous lesions. There are mild multilevel degenerative changes in the visualized spine. Review of the MIP images confirms the  above findings. IMPRESSION: 1. No acute aortic syndrome. No thoracic or abdominal aortic aneurysm, dissection, penetrating atherosclerotic ulcer or vasculitis. 2. No acute inflammatory process identified within the chest, abdomen or pelvis. 3. Multiple other nonacute observations, as described above. Electronically Signed   By: Ree Molt M.D.   On: 10/05/2023 15:33   DG Chest Portable 1 View Result Date: 10/05/2023 CLINICAL DATA:  Chest pain. EXAM: PORTABLE CHEST 1 VIEW COMPARISON:  September 16, 2023. FINDINGS: The heart size and mediastinal contours are within normal limits. Both lungs are clear. The visualized skeletal structures are unremarkable. IMPRESSION: No active disease. Electronically Signed   By: Lynwood Landy Raddle M.D.   On: 10/05/2023 10:38    PROCEDURES:  Critical Care performed: No  Procedures   MEDICATIONS ORDERED IN ED: Medications  alum & mag hydroxide-simeth (MAALOX/MYLANTA) 200-200-20 MG/5ML suspension 30 mL (30 mLs Oral Given 10/05/23 1317)  acetaminophen  (TYLENOL ) tablet 650 mg (650 mg Oral Given 10/05/23 1317)  morphine  (PF) 4 MG/ML injection 4 mg (4 mg Intravenous Given 10/05/23 1411)  iohexol  (OMNIPAQUE ) 350 MG/ML injection 100 mL (100 mLs Intravenous Contrast Given 10/05/23 1510)     IMPRESSION / MDM / ASSESSMENT AND PLAN / ED COURSE  I reviewed the triage vital signs and the nursing notes.  Differential diagnosis includes, but is not limited to, ACS, pneumonia, pneumothorax, low suspicion PE.  Dissection given complete resolution of symptoms, GERD, pancreatitis  Patient's presentation is most consistent with acute presentation with potential threat to life or bodily function.  78 year old female presenting to the emergency department with resolved episode of epigastric and chest pain.  Labs reassuring with mild leukopenia, overall reassuring CMP, normal lipase.  Negative troponin x 2.  X-Fidencia Mccloud without acute findings.  EKG without acute ischemic findings.  Reassuring  abdominal exam.  Completely resolved chest pain on presentation here.  Patient reassessed following completion of workup.  She did report some recurrent pain described as a burning sensation in her epigastric area.  Negative Murphy sign, no lower abdominal tenderness.  Will trial GI cocktail and Tylenol .    Clinical Course as of 10/05/23 1613  Tue Oct 05, 2023  1350 On reassessment, patient reports worsening symptoms now reporting severe pain over her right side radiating across her abdomen into her back.  Remains with stable vitals but uncomfortable appearing on exam.  Will order for morphine , obtain CT dissection protocol to further evaluate. [NR]  1612 CT Angio Chest/Abd/Pel for Dissection W and/or Wo Contrast CT dissection protocol resulted without evidence of aortic syndrome or other acute findings.  Patient reassessed and once again reports improved symptoms.  I did consider admission given her recurrent symptoms, but patient reports these are now resolved and she is comfortable with discharge home.  With multiple similar prior presentations, I do think discharge is reasonable.  Strict return precautions were provided.  She was discharged stable condition. [NR]    Clinical Course User Index [NR] Levander Slate, MD     FINAL CLINICAL IMPRESSION(S) / ED DIAGNOSES   Final diagnoses:  Epigastric pain  Nonspecific chest pain     Rx / DC Orders   ED Discharge Orders     None  Note:  This document was prepared using Dragon voice recognition software and may include unintentional dictation errors.   Levander Slate, MD 10/05/23 [phone]

## 2023-10-05 NOTE — Discharge Instructions (Addendum)
 You are seen in the emergency room today for your chest pain.  Your testing fortunately did not show an emergency cause for this.  Please make sure you are taking your medications including your Protonix  as directed.  Return to the ER for new or worsening symptoms.

## 2023-10-05 NOTE — Telephone Encounter (Signed)
 FYI Only or Action Required?: FYI only for provider.  Patient was last seen in primary care on 06/29/2023 by Vicci Duwaine SQUIBB, DO.  Called Nurse Triage reporting Abdominal Pain.  Symptoms began today.  Interventions attempted: Nothing.  Symptoms are: gradually worsening.  Triage Disposition: Call EMS 911 Now  Patient/caregiver understands and will follow disposition?: Yes Reason for Disposition  Sounds like a life-threatening emergency to the triager  Sounds like a life-threatening emergency to the triager  Answer Assessment - Initial Assessment Questions LOCATION: Where does it hurt?      Abdomen   ONSET: When did the pain begin? (e.g., minutes, hours or days ago)      This morning (10/05/23)  SUDDEN: Gradual or sudden onset?     Sudden  OTHER SYMPTOMS: Do you have any other symptoms? (e.g., back pain, diarrhea, fever, urination pain, vomiting)       Vomiting, lightheadedness and headache  Answer Assessment - Initial Assessment Questions SYMPTOM: What is the main symptom you are concerned about? (e.g., weakness, numbness)     Dizziness, headache, vomiting  ONSET: When did this start? (e.g., minutes, hours, days; while sleeping)     This morning (10/05/23)  NEUROLOGIC SYMPTOMS: Have you had any of the following symptoms: headache, dizziness, vision loss, double vision, changes in speech, unsteady on your feet?     Dizziness and headache  OTHER SYMPTOMS: Do you have any other symptoms?     Vomiting and abdominal pain  Protocols used: Abdominal Pain - Female-A-AH, Neurologic Deficit-A-AH

## 2023-11-12 DIAGNOSIS — R1110 Vomiting, unspecified: Secondary | ICD-10-CM | POA: Diagnosis not present

## 2023-11-12 DIAGNOSIS — R112 Nausea with vomiting, unspecified: Secondary | ICD-10-CM | POA: Diagnosis not present

## 2023-11-12 DIAGNOSIS — R1010 Upper abdominal pain, unspecified: Secondary | ICD-10-CM | POA: Diagnosis not present

## 2023-11-12 DIAGNOSIS — Z880 Allergy status to penicillin: Secondary | ICD-10-CM | POA: Diagnosis not present

## 2023-11-12 DIAGNOSIS — Z79899 Other long term (current) drug therapy: Secondary | ICD-10-CM | POA: Diagnosis not present

## 2023-11-12 DIAGNOSIS — F0390 Unspecified dementia without behavioral disturbance: Secondary | ICD-10-CM | POA: Diagnosis not present

## 2023-11-12 DIAGNOSIS — Z8673 Personal history of transient ischemic attack (TIA), and cerebral infarction without residual deficits: Secondary | ICD-10-CM | POA: Diagnosis not present

## 2023-11-12 DIAGNOSIS — Z9049 Acquired absence of other specified parts of digestive tract: Secondary | ICD-10-CM | POA: Diagnosis not present

## 2023-11-12 DIAGNOSIS — E119 Type 2 diabetes mellitus without complications: Secondary | ICD-10-CM | POA: Diagnosis not present

## 2023-11-12 DIAGNOSIS — Z90710 Acquired absence of both cervix and uterus: Secondary | ICD-10-CM | POA: Diagnosis not present

## 2023-11-12 DIAGNOSIS — R109 Unspecified abdominal pain: Secondary | ICD-10-CM | POA: Diagnosis not present

## 2023-12-15 DIAGNOSIS — E7800 Pure hypercholesterolemia, unspecified: Secondary | ICD-10-CM | POA: Diagnosis not present

## 2023-12-15 DIAGNOSIS — R413 Other amnesia: Secondary | ICD-10-CM | POA: Diagnosis not present

## 2023-12-15 DIAGNOSIS — K219 Gastro-esophageal reflux disease without esophagitis: Secondary | ICD-10-CM | POA: Diagnosis not present

## 2023-12-15 DIAGNOSIS — E559 Vitamin D deficiency, unspecified: Secondary | ICD-10-CM | POA: Diagnosis not present

## 2023-12-15 DIAGNOSIS — D518 Other vitamin B12 deficiency anemias: Secondary | ICD-10-CM | POA: Diagnosis not present

## 2023-12-15 DIAGNOSIS — E1165 Type 2 diabetes mellitus with hyperglycemia: Secondary | ICD-10-CM | POA: Diagnosis not present

## 2024-01-17 DIAGNOSIS — E7800 Pure hypercholesterolemia, unspecified: Secondary | ICD-10-CM | POA: Diagnosis not present

## 2024-01-17 DIAGNOSIS — R413 Other amnesia: Secondary | ICD-10-CM | POA: Diagnosis not present
